# Patient Record
Sex: Male | Born: 1970 | Race: White | Hispanic: No | Smoking: Never smoker
Health system: Southern US, Community
[De-identification: ages and names within clinical notes are randomized; demographics above are authoritative.]

## PROBLEM LIST (undated history)

## (undated) DIAGNOSIS — E790 Hyperuricemia without signs of inflammatory arthritis and tophaceous disease: Secondary | ICD-10-CM

## (undated) DIAGNOSIS — I10 Essential (primary) hypertension: Secondary | ICD-10-CM

## (undated) DIAGNOSIS — R7309 Other abnormal glucose: Secondary | ICD-10-CM

## (undated) DIAGNOSIS — K209 Esophagitis, unspecified without bleeding: Secondary | ICD-10-CM

## (undated) DIAGNOSIS — E119 Type 2 diabetes mellitus without complications: Secondary | ICD-10-CM

## (undated) DIAGNOSIS — E786 Lipoprotein deficiency: Secondary | ICD-10-CM

## (undated) DIAGNOSIS — K5792 Diverticulitis of intestine, part unspecified, without perforation or abscess without bleeding: Secondary | ICD-10-CM

## (undated) DIAGNOSIS — E663 Overweight: Secondary | ICD-10-CM

## (undated) HISTORY — DX: Hyperuricemia without signs of inflammatory arthritis and tophaceous disease: E79.0

## (undated) HISTORY — DX: Esophagitis, unspecified without bleeding: K20.90

## (undated) HISTORY — DX: Other abnormal glucose: R73.09

## (undated) HISTORY — DX: Lipoprotein deficiency: E78.6

## (undated) HISTORY — DX: Overweight: E66.3

## (undated) HISTORY — DX: Type 2 diabetes mellitus without complications: E11.9

## (undated) HISTORY — DX: Diverticulitis of intestine, part unspecified, without perforation or abscess without bleeding: K57.92

---

## 2014-07-14 ENCOUNTER — Emergency Department
Admission: EM | Admit: 2014-07-14 | Discharge: 2014-07-14 | Disposition: A | Discharge status: Home / Self Care | Payer: BLUE CROSS/BLUE SHIELD | Attending: Emergency Medicine | Admitting: Emergency Medicine

## 2014-07-14 ENCOUNTER — Encounter: Payer: Self-pay | Admitting: Emergency Medicine

## 2014-07-14 ENCOUNTER — Emergency Department: Payer: BLUE CROSS/BLUE SHIELD

## 2014-07-14 DIAGNOSIS — K5732 Diverticulitis of large intestine without perforation or abscess without bleeding: Secondary | ICD-10-CM | POA: Diagnosis not present

## 2014-07-14 DIAGNOSIS — Z79899 Other long term (current) drug therapy: Secondary | ICD-10-CM | POA: Insufficient documentation

## 2014-07-14 DIAGNOSIS — D72829 Elevated white blood cell count, unspecified: Secondary | ICD-10-CM | POA: Insufficient documentation

## 2014-07-14 DIAGNOSIS — R109 Unspecified abdominal pain: Secondary | ICD-10-CM

## 2014-07-14 DIAGNOSIS — I10 Essential (primary) hypertension: Secondary | ICD-10-CM | POA: Insufficient documentation

## 2014-07-14 DIAGNOSIS — R1030 Lower abdominal pain, unspecified: Secondary | ICD-10-CM | POA: Diagnosis present

## 2014-07-14 HISTORY — DX: Essential (primary) hypertension: I10

## 2014-07-14 LAB — URINALYSIS COMPLETE WITH MICROSCOPIC (ARMC ONLY)
Bacteria, UA: NONE SEEN
Bilirubin Urine: NEGATIVE
GLUCOSE, UA: NEGATIVE mg/dL
Hgb urine dipstick: NEGATIVE
Ketones, ur: NEGATIVE mg/dL
NITRITE: NEGATIVE
Protein, ur: NEGATIVE mg/dL
RBC / HPF: NONE SEEN RBC/hpf (ref 0–5)
SPECIFIC GRAVITY, URINE: 1.011 (ref 1.005–1.030)
Squamous Epithelial / LPF: NONE SEEN
pH: 5 (ref 5.0–8.0)

## 2014-07-14 LAB — COMPREHENSIVE METABOLIC PANEL
ALK PHOS: 115 U/L (ref 38–126)
ALT: 18 U/L (ref 17–63)
AST: 21 U/L (ref 15–41)
Albumin: 3.7 g/dL (ref 3.5–5.0)
Anion gap: 9 (ref 5–15)
BUN: 10 mg/dL (ref 6–20)
CO2: 26 mmol/L (ref 22–32)
CREATININE: 1.15 mg/dL (ref 0.61–1.24)
Calcium: 8.7 mg/dL — ABNORMAL LOW (ref 8.9–10.3)
Chloride: 99 mmol/L — ABNORMAL LOW (ref 101–111)
GFR calc Af Amer: >60 mL/min (ref >60)
GFR calc non Af Amer: >60 mL/min (ref >60)
GLUCOSE: 143 mg/dL — AB (ref 65–99)
Potassium: 3.9 mmol/L (ref 3.5–5.1)
Sodium: 134 mmol/L — ABNORMAL LOW (ref 135–145)
Total Bilirubin: 0.5 mg/dL (ref 0.3–1.2)
Total Protein: 8 g/dL (ref 6.5–8.1)

## 2014-07-14 LAB — LIPASE, BLOOD: Lipase: 27 U/L (ref 22–51)

## 2014-07-14 LAB — CBC WITH DIFFERENTIAL/PLATELET
BASOS PCT: 1 %
Basophils Absolute: 0.1 10*3/uL (ref 0–0.1)
EOS ABS: 0.3 10*3/uL (ref 0–0.7)
Eosinophils Relative: 2 %
HEMATOCRIT: 51.4 % (ref 40.0–52.0)
Hemoglobin: 17.2 g/dL (ref 13.0–18.0)
Lymphocytes Relative: 9 %
Lymphs Abs: 2 10*3/uL (ref 1.0–3.6)
MCH: 28.1 pg (ref 26.0–34.0)
MCHC: 33.5 g/dL (ref 32.0–36.0)
MCV: 83.8 fL (ref 80.0–100.0)
MONOS PCT: 8 %
Monocytes Absolute: 1.8 10*3/uL — ABNORMAL HIGH (ref 0.2–1.0)
NEUTROS ABS: 17.3 10*3/uL — AB (ref 1.4–6.5)
Neutrophils Relative %: 80 %
Platelets: 295 10*3/uL (ref 150–440)
RBC: 6.13 MIL/uL — ABNORMAL HIGH (ref 4.40–5.90)
RDW: 14.6 % — ABNORMAL HIGH (ref 11.5–14.5)
WBC: 21.5 10*3/uL — AB (ref 3.8–10.6)

## 2014-07-14 MED ORDER — METRONIDAZOLE 500 MG PO TABS
ORAL_TABLET | ORAL | Status: AC
  Administered 2014-07-14: 500 mg via ORAL
  Filled 2014-07-14: qty 1

## 2014-07-14 MED ORDER — CIPROFLOXACIN HCL 500 MG PO TABS
500.0000 mg | ORAL_TABLET | Freq: Once | ORAL | Status: AC
  Administered 2014-07-14: 500 mg via ORAL

## 2014-07-14 MED ORDER — CIPROFLOXACIN HCL 500 MG PO TABS
ORAL_TABLET | ORAL | Status: AC
  Administered 2014-07-14: 500 mg via ORAL
  Filled 2014-07-14: qty 1

## 2014-07-14 MED ORDER — METRONIDAZOLE 500 MG PO TABS
500.0000 mg | ORAL_TABLET | Freq: Once | ORAL | Status: AC
  Administered 2014-07-14: 500 mg via ORAL

## 2014-07-14 MED ORDER — SODIUM CHLORIDE 0.9 % IV SOLN
Freq: Once | INTRAVENOUS | Status: AC
  Administered 2014-07-14: 14:00:00 via INTRAVENOUS

## 2014-07-14 MED ORDER — IOHEXOL 350 MG/ML SOLN
100.0000 mL | Freq: Once | INTRAVENOUS | Status: AC | PRN
  Administered 2014-07-14: 100 mL via INTRAVENOUS

## 2014-07-14 MED ORDER — CIPROFLOXACIN HCL 500 MG PO TABS: 500.0000 mg | ORAL_TABLET | Freq: Two times a day (BID) | ORAL | Status: AC

## 2014-07-14 MED ORDER — IOHEXOL 240 MG/ML SOLN
25.0000 mL | Freq: Once | INTRAMUSCULAR | Status: AC | PRN
  Administered 2014-07-14: 25 mL via ORAL

## 2014-07-14 MED ORDER — METRONIDAZOLE 500 MG PO TABS: 500.0000 mg | ORAL_TABLET | Freq: Three times a day (TID) | ORAL | Status: DC

## 2014-07-14 MED ORDER — OXYCODONE-ACETAMINOPHEN 5-325 MG PO TABS: 1.0000 | ORAL_TABLET | Freq: Four times a day (QID) | ORAL | Status: DC | PRN

## 2014-07-14 NOTE — ED Notes (Signed)
Pt informed to return if any life threatening symptoms occur.  

## 2014-07-14 NOTE — Discharge Instructions (Signed)

## 2014-07-14 NOTE — ED Provider Notes (Signed)
[facility] Emergency Department Provider Note     Time seen: ----------------------------------------- 1:42 PM on 07/14/2014 -----------------------------------------    I have reviewed the triage vital signs and the nursing notes.   HISTORY  Chief Complaint Abdominal Pain    HPI Winfred Iiams is a 44 y.o. male who presents ER for dull lower abdominal pain since yesterday. Patient states pain is persisted the point where hurts to walk, does not have a history of this, denies fevers chills nausea vomiting diarrhea. Patient states nothing makes the pain better. Went to the work nurse today and was sent here for evaluation. Pain currently is mild to moderate    Past Medical History  Diagnosis Date  . Hypertension     There are no active problems to display for this patient.   History reviewed. No pertinent past surgical history.  Current Outpatient Rx  Name  Route  Sig  Dispense  Refill  . lisinopril-hydrochlorothiazide (PRINZIDE,ZESTORETIC) 10-12.5 MG per tablet   Oral   Take 1 tablet by mouth daily.           Allergies Review of patient's allergies indicates no known allergies.  History reviewed. No pertinent family history.  Social History History  Substance Use Topics  . Smoking status: Never Smoker   . Smokeless tobacco: Not on file  . Alcohol Use: No    Review of Systems Constitutional: Negative for fever. Eyes: Negative for visual changes. ENT: Negative for sore throat. Cardiovascular: Negative for chest pain. Respiratory: Negative for shortness of breath. Gastrointestinal: As if her abdominal pain, negative for vomiting or diarrhea Genitourinary: Negative for dysuria. Musculoskeletal: Negative for back pain. Skin: Negative for rash. Neurological: Negative for headaches, focal weakness or numbness.  10-point ROS otherwise negative.  ____________________________________________   PHYSICAL EXAM:  VITAL  SIGNS: ED Triage Vitals  Enc Vitals Group     BP 07/14/14 1132 126/81 mmHg     Pulse Rate 07/14/14 1132 92     Resp 07/14/14 1132 20     Temp 07/14/14 1132 98.6 F (37 C)     Temp Source 07/14/14 1132 Oral     SpO2 07/14/14 1132 96 %     Weight 07/14/14 1132 270 lb (122.471 kg)     Height 07/14/14 1132  (1.803 m)     Head Cir --      Peak Flow --      Pain Score 07/14/14 1134 5     Pain Loc --      Pain Edu? --      Excl. in GC? --     Constitutional: Alert and oriented. Well appearing and in no distress. Eyes: Conjunctivae are normal. PERRL. Normal extraocular movements. ENT   Head: Normocephalic and atraumatic.   Nose: No congestion/rhinnorhea.   Mouth/Throat: Mucous membranes are moist.   Neck: No stridor. Hematological/Lymphatic/Immunilogical: No cervical lymphadenopathy. Cardiovascular: Normal rate, regular rhythm. Normal and symmetric distal pulses are present in all extremities. No murmurs, rubs, or gallops. Respiratory: Normal respiratory effort without tachypnea nor retractions. Breath sounds are clear and equal bilaterally. No wheezes/rales/rhonchi. Gastrointestinal: Mild suprapubic tenderness, hypoactive bowel sounds. No rebound or guarding. Pain over McBurney's point. Musculoskeletal: Nontender with normal range of motion in all extremities. No joint effusions.  No lower extremity tenderness nor edema. Neurologic:  Normal speech and language. No gross focal neurologic deficits are appreciated. Speech is normal. No gait instability. Skin:  Skin is warm, dry and intact. No rash noted. Psychiatric: Mood and affect  are normal. Speech and behavior are normal. Patient exhibits appropriate insight and judgment.  ____________________________________________    LABS (pertinent positives/negatives)  Labs Reviewed  CBC WITH DIFFERENTIAL/PLATELET - Abnormal; Notable for the following:    WBC 21.5 (*)    RBC 6.13 (*)    RDW 14.6 (*)    Neutro Abs 17.3  (*)    Monocytes Absolute 1.8 (*)    All other components within normal limits  COMPREHENSIVE METABOLIC PANEL - Abnormal; Notable for the following:    Sodium 134 (*)    Chloride 99 (*)    Glucose, Bld 143 (*)    Calcium 8.7 (*)    All other components within normal limits  URINALYSIS COMPLETEWITH MICROSCOPIC ([facility] ONLY) - Abnormal; Notable for the following:    Color, Urine YELLOW (*)    APPearance CLEAR (*)    Leukocytes, UA TRACE (*)    All other components within normal limits  LIPASE, BLOOD   ___________________________________________  ED COURSE:  Pertinent labs & imaging results that were available during my care of the patient were reviewed by me and considered in my medical decision making (see chart for details).  Patient with likely diverticulitis, marked leukocytosis. Will need CT abdomen and pelvis with contrast. ____________________________________________   RADIOLOGY  CT abdomen and pelvis with contrast is pending  ____________________________________________    FINAL ASSESSMENT AND PLAN  Abdominal pain and leukocytosis,  Plan: Patient's CT is pending at this time, will be checked out to Dr. Langston MaskerShaevitz for final disposition    Mayford KnifeWilliams, Cecille AmsterdamJonathan E, MD   Emily FilbertJonathan E Williams, MD 07/14/14 1459

## 2014-07-14 NOTE — ED Notes (Signed)
Developed lower abd pain since yesterday. Denies any n/v or urinary sx's

## 2014-07-14 NOTE — ED Provider Notes (Signed)
  Physical Exam  BP 121/92 mmHg  Pulse 92  Temp(Src) 98.6 F (37 C) (Oral)  Resp 16  Ht 5\' 11"  (1.803 m)  Wt 270 lb (122.471 kg)  BMI 37.67 kg/m2  SpO2 98%  Physical Exam  ED Course  Procedures  MDM Care assumed at sign out. Patient here with LLQ pain. WBC 21. Sign out pending CT. CT showed uncomplicated diverticulitis. No pain now. No diarrhea to suggest C diff. Will dc home with cipro, flagyl.   Richardean Canalavid H Yao, MD 07/14/14 [phone]

## 2015-04-29 ENCOUNTER — Other Ambulatory Visit: Payer: Self-pay | Admitting: Physician Assistant

## 2016-11-26 DIAGNOSIS — D751 Secondary polycythemia: Secondary | ICD-10-CM | POA: Insufficient documentation

## 2016-11-26 NOTE — Progress Notes (Deleted)
  [facility]  Telephone:(336) [phone] Fax:(336) [phone]  ID: Wayne Hernandez OB: 1970/10/18  MR#: [id number]  XNA#:[id number]  Patient Care Team: Patient, No Pcp Per as PCP - General (General Practice)  CHIEF COMPLAINT: Polycythemia, secondary.  INTERVAL HISTORY: ***  REVIEW OF SYSTEMS:   ROS  As per HPI. Otherwise, a complete review of systems is negative.  PAST MEDICAL HISTORY: Past Medical History:  Diagnosis Date  . Hypertension     PAST SURGICAL HISTORY: No past surgical history on file.  FAMILY HISTORY: No family history on file.  ADVANCED DIRECTIVES (Y/N):  N  HEALTH MAINTENANCE: Social History  Substance Use Topics  . Smoking status: Never Smoker  . Smokeless tobacco: Not on file  . Alcohol use No     Colonoscopy:  PAP:  Bone density:  Lipid panel:  No Known Allergies  Current Outpatient Prescriptions  Medication Sig Dispense Refill  . ibuprofen (ADVIL,MOTRIN) 200 MG tablet Take 600 mg by mouth daily as needed for moderate pain.    Marland Kitchen lisinopril-hydrochlorothiazide (PRINZIDE,ZESTORETIC) 10-12.5 MG per tablet Take 1 tablet by mouth daily.    . metroNIDAZOLE (FLAGYL) 500 MG tablet Take 1 tablet (500 mg total) by mouth 3 (three) times daily. 21 tablet 0  . oxyCODONE-acetaminophen (ROXICET) 5-325 MG per tablet Take 1 tablet by mouth every 6 (six) hours as needed. 20 tablet 0   No current facility-administered medications for this visit.     OBJECTIVE: There were no vitals filed for this visit.   There is no height or weight on file to calculate BMI.    ECOG FS:{CHL ONC [id number]  General: Well-developed, well-nourished, no acute distress. Eyes: Pink conjunctiva, anicteric sclera. HEENT: Normocephalic, moist mucous membranes, clear oropharnyx. Lungs: Clear to auscultation bilaterally. Heart: Regular rate and rhythm. No rubs, murmurs, or gallops. Abdomen: Soft, nontender, nondistended. No organomegaly noted, normoactive bowel  sounds. Musculoskeletal: No edema, cyanosis, or clubbing. Neuro: Alert, answering all questions appropriately. Cranial nerves grossly intact. Skin: No rashes or petechiae noted. Psych: Normal affect. Lymphatics: No cervical, calvicular, axillary or inguinal LAD.   LAB RESULTS:  Lab Results  Component Value Date   NA 134 (L) 07/14/2014   K 3.9 07/14/2014   CL 99 (L) 07/14/2014   CO2 26 07/14/2014   GLUCOSE 143 (H) 07/14/2014   BUN 10 07/14/2014   CREATININE 1.15 07/14/2014   CALCIUM 8.7 (L) 07/14/2014   PROT 8.0 07/14/2014   ALBUMIN 3.7 07/14/2014   AST 21 07/14/2014   ALT 18 07/14/2014   ALKPHOS 115 07/14/2014   BILITOT 0.5 07/14/2014   GFRNONAA >60 07/14/2014   GFRAA >60 07/14/2014    Lab Results  Component Value Date   WBC 21.5 (H) 07/14/2014   NEUTROABS 17.3 (H) 07/14/2014   HGB 17.2 07/14/2014   HCT 51.4 07/14/2014   MCV 83.8 07/14/2014   PLT 295 07/14/2014     STUDIES: No results found.  ASSESSMENT: Polycythemia, secondary  PLAN:    1, Polycythemia, secondary:  Patient expressed understanding and was in agreement with this plan. He also understands that He can call clinic at any time with any questions, concerns, or complaints.   Cancer Staging No matching staging information was found for the patient.  Jeralyn Ruths, MD   11/26/2016 10:50 PM

## 2016-11-29 ENCOUNTER — Encounter: Payer: Self-pay | Admitting: Oncology

## 2016-11-29 ENCOUNTER — Inpatient Hospital Stay: Payer: BLUE CROSS/BLUE SHIELD | Attending: Oncology | Admitting: Oncology

## 2016-11-29 ENCOUNTER — Inpatient Hospital Stay: Payer: BLUE CROSS/BLUE SHIELD

## 2016-11-29 ENCOUNTER — Inpatient Hospital Stay: Payer: BLUE CROSS/BLUE SHIELD | Admitting: Oncology

## 2016-11-29 VITALS — BP 116/78 | HR 67 | Temp 97.4°F | Ht 71.0 in | Wt 249.1 lb

## 2016-11-29 DIAGNOSIS — Z7982 Long term (current) use of aspirin: Secondary | ICD-10-CM

## 2016-11-29 DIAGNOSIS — Z79899 Other long term (current) drug therapy: Secondary | ICD-10-CM | POA: Insufficient documentation

## 2016-11-29 DIAGNOSIS — I10 Essential (primary) hypertension: Secondary | ICD-10-CM | POA: Diagnosis not present

## 2016-11-29 DIAGNOSIS — D751 Secondary polycythemia: Secondary | ICD-10-CM | POA: Diagnosis not present

## 2016-11-29 LAB — CBC
HEMATOCRIT: 50 % (ref 40.0–52.0)
Hemoglobin: 17.1 g/dL (ref 13.0–18.0)
MCH: 28.4 pg (ref 26.0–34.0)
MCHC: 34.2 g/dL (ref 32.0–36.0)
MCV: 82.9 fL (ref 80.0–100.0)
PLATELETS: 280 10*3/uL (ref 150–440)
RBC: 6.04 MIL/uL — ABNORMAL HIGH (ref 4.40–5.90)
RDW: 13.6 % (ref 11.5–14.5)
WBC: 11 10*3/uL — ABNORMAL HIGH (ref 3.8–10.6)

## 2016-11-29 LAB — IRON AND TIBC
IRON: 89 ug/dL (ref 45–182)
Saturation Ratios: 30 % (ref 17.9–39.5)
TIBC: 301 ug/dL (ref 250–450)
UIBC: 212 ug/dL

## 2016-11-29 LAB — FERRITIN: Ferritin: 115 ng/mL (ref 24–336)

## 2016-11-29 NOTE — Progress Notes (Signed)
[facility]  [phone] Fax:(336) [phone]  ID: Wayne Hernandez OB: 08/07/1970  MR#: [id number]  AOZ#:[id number]  Patient Care Team: Gilles Chiquito, MD as PCP - General (Family Medicine)  CHIEF COMPLAINT: Polycythemia, secondary.  INTERVAL HISTORY: Patient is a 46 year old male with no significant past medical history who was noted to have a slowly increasing hemoglobin on routine blood work. He is a nonsmoker and does not take testosterone injections. He currently feels well and is at his baseline. He has no neurologic complaints. He denies any recent fevers or illnesses. He has no new medications. He denies any chest pain or shortness of breath. He denies any nausea, vomiting, constipation, or diarrhea. He has no urinary complaints. Patient  offers no specific complaints today.  REVIEW OF SYSTEMS:   Review of Systems  Constitutional: Negative.  Negative for fever, malaise/fatigue and weight loss.  Respiratory: Negative.  Negative for cough and shortness of breath.   Cardiovascular: Negative.  Negative for chest pain and leg swelling.  Gastrointestinal: Negative.  Negative for abdominal pain, blood in stool and melena.  Genitourinary: Negative.   Musculoskeletal: Negative.   Skin: Negative.  Negative for rash.  Neurological: Negative.  Negative for sensory change and weakness.  Psychiatric/Behavioral: Negative.  The patient is not nervous/anxious.     As per HPI. Otherwise, a complete review of systems is negative.  PAST MEDICAL HISTORY: Past Medical History:  Diagnosis Date  . Hypertension     PAST SURGICAL HISTORY: History reviewed. No pertinent surgical history.  FAMILY HISTORY: History reviewed. No pertinent family history.  ADVANCED DIRECTIVES (Y/N):  N  HEALTH MAINTENANCE: Social History  Substance Use Topics  . Smoking status: Never Smoker  . Smokeless tobacco: Never Used  . Alcohol use Yes     Comment: occasional      Colonoscopy:  PAP:  Bone density:  Lipid panel:  No Known Allergies  Current Outpatient Prescriptions  Medication Sig Dispense Refill  . aspirin EC 81 MG tablet Take 81 mg by mouth daily.    Marland Kitchen ibuprofen (ADVIL,MOTRIN) 200 MG tablet Take 600 mg by mouth as needed.     Marland Kitchen lisinopril (PRINIVIL,ZESTRIL) 10 MG tablet Take 10 mg by mouth daily.     No current facility-administered medications for this visit.     OBJECTIVE: Vitals:   11/29/16 1356  BP: 116/78  Pulse: 67  Temp: (!) 97.4 F (36.3 C)     Body mass index is 34.75 kg/m.    ECOG FS:0 - Asymptomatic  General: Well-developed, well-nourished, no acute distress. Eyes: Pink conjunctiva, anicteric sclera. HEENT: Normocephalic, moist mucous membranes, clear oropharnyx. Lungs: Clear to auscultation bilaterally. Heart: Regular rate and rhythm. No rubs, murmurs, or gallops. Abdomen: Soft, nontender, nondistended. No organomegaly noted, normoactive bowel sounds. Musculoskeletal: No edema, cyanosis, or clubbing. Neuro: Alert, answering all questions appropriately. Cranial nerves grossly intact. Skin: No rashes or petechiae noted. Psych: Normal affect. Lymphatics: No cervical, calvicular, axillary or inguinal LAD.   LAB RESULTS:  Lab Results  Component Value Date   NA 134 (L) 07/14/2014   K 3.9 07/14/2014   CL 99 (L) 07/14/2014   CO2 26 07/14/2014   GLUCOSE 143 (H) 07/14/2014   BUN 10 07/14/2014   CREATININE 1.15 07/14/2014   CALCIUM 8.7 (L) 07/14/2014   PROT 8.0 07/14/2014   ALBUMIN 3.7 07/14/2014   AST 21 07/14/2014   ALT 18 07/14/2014   ALKPHOS 115 07/14/2014   BILITOT 0.5 07/14/2014   GFRNONAA >60 07/14/2014  GFRAA >60 07/14/2014    Lab Results  Component Value Date   WBC 11.0 (H) 11/29/2016   NEUTROABS 17.3 (H) 07/14/2014   HGB 17.1 11/29/2016   HCT 50.0 11/29/2016   MCV 82.9 11/29/2016   PLT 280 11/29/2016     STUDIES: No results found.  ASSESSMENT: Polycythemia, secondary  PLAN:     1, Polycythemia, secondary: By report, patient's hemoglobin has been trending up and his most recent value was greater than 18.0. Today's result is slightly decreased from that value at 17.1. The remainder of his laboratory work including iron stores, erythropoietin level, JAK-2 mutation, and hemachromatosis mutation are all pending at time of dictation. No intervention is needed at this time. Patient will return to clinic in 3 weeks with repeat laboratory work and further evaluation.  Approximately 45 minutes was spent in discussion of which greater than 50% was consultation.  Patient expressed understanding and was in agreement with this plan. He also understands that He can call clinic at any time with any questions, concerns, or complaints.    Wayne Ruthsimothy J Jennifermarie Franzen, MD   11/29/2016 3:16 PM

## 2016-11-29 NOTE — Progress Notes (Signed)
New patient consult for polycythemia. Patient states that he is feeling well today and denies having any pain. He states that his blood levels have been gradually rising over the past few years.

## 2016-11-30 LAB — ERYTHROPOIETIN: Erythropoietin: 9.3 m[IU]/mL (ref 2.6–18.5)

## 2016-12-02 LAB — CARBON MONOXIDE, BLOOD (PERFORMED AT REF LAB): Carbon Monoxide, Blood: 3.2 % (ref 0.0–3.6)

## 2016-12-03 LAB — JAK2 GENOTYPR

## 2016-12-05 ENCOUNTER — Encounter: Payer: Self-pay | Admitting: Family Medicine

## 2016-12-05 LAB — HEMOCHROMATOSIS DNA-PCR(C282Y,H63D)

## 2016-12-11 NOTE — Progress Notes (Signed)
[facility]  Telephone:(336) [phone] [phone]) [phone]  ID: Linward HeadlandJohnathan Agresta OB: 02/19/1970  MR#: [id number]  AOZ#:[id number]#:[id number]  Patient Care Team: Gilles Chiquitoabinowitz, Joseph H, MD as PCP - General (Family Medicine)  CHIEF COMPLAINT: Polycythemia, secondary.  INTERVAL HISTORY: Patient returns to clinic today for repeat laboratory can further evaluation.  He continues to feel well and remains asymptomatic. He has no neurologic complaints. He denies any recent fevers or illnesses. He has no new medications. He denies any chest pain or shortness of breath. He denies any nausea, vomiting, constipation, or diarrhea. He has no urinary complaints. Patient  offers no specific complaints today.  REVIEW OF SYSTEMS:   Review of Systems  Constitutional: Negative.  Negative for fever, malaise/fatigue and weight loss.  Respiratory: Negative.  Negative for cough and shortness of breath.   Cardiovascular: Negative.  Negative for chest pain and leg swelling.  Gastrointestinal: Negative.  Negative for abdominal pain.  Genitourinary: Negative.   Musculoskeletal: Negative.   Skin: Negative.  Negative for rash.  Neurological: Negative.   Psychiatric/Behavioral: Negative.  The patient is not nervous/anxious.     As per HPI. Otherwise, a complete review of systems is negative.  PAST MEDICAL HISTORY: Past Medical History:  Diagnosis Date  . Hypertension     PAST SURGICAL HISTORY: History reviewed. No pertinent surgical history.  FAMILY HISTORY: History reviewed. No pertinent family history.  ADVANCED DIRECTIVES (Y/N):  N  HEALTH MAINTENANCE: Social History   Tobacco Use  . Smoking status: Never Smoker  . Smokeless tobacco: Never Used  Substance Use Topics  . Alcohol use: Yes    Comment: occasional  . Drug use: Not on file     Colonoscopy:  PAP:  Bone density:  Lipid panel:  No Known Allergies  Current Outpatient Medications  Medication Sig Dispense Refill  . aspirin EC  81 MG tablet Take 81 mg by mouth daily.    Marland Kitchen. ibuprofen (ADVIL,MOTRIN) 200 MG tablet Take 600 mg by mouth as needed.     Marland Kitchen. lisinopril (PRINIVIL,ZESTRIL) 10 MG tablet Take 10 mg by mouth daily.     No current facility-administered medications for this visit.     OBJECTIVE: Vitals:   12/17/16 1528  BP: 125/87  Pulse: 66  Resp: 20  Temp: (!) 97.5 F (36.4 C)     Body mass index is 35.45 kg/m.    ECOG FS:0 - Asymptomatic  General: Well-developed, well-nourished, no acute distress. Eyes: Pink conjunctiva, anicteric sclera. Lungs: Clear to auscultation bilaterally. Heart: Regular rate and rhythm. No rubs, murmurs, or gallops. Abdomen: Soft, nontender, nondistended. No organomegaly noted, normoactive bowel sounds. Musculoskeletal: No edema, cyanosis, or clubbing. Neuro: Alert, answering all questions appropriately. Cranial nerves grossly intact. Skin: No rashes or petechiae noted. Psych: Normal affect.   LAB RESULTS:  Lab Results  Component Value Date   NA 134 (L) 07/14/2014   K 3.9 07/14/2014   CL 99 (L) 07/14/2014   CO2 26 07/14/2014   GLUCOSE 143 (H) 07/14/2014   BUN 10 07/14/2014   CREATININE 1.15 07/14/2014   CALCIUM 8.7 (L) 07/14/2014   PROT 8.0 07/14/2014   ALBUMIN 3.7 07/14/2014   AST 21 07/14/2014   ALT 18 07/14/2014   ALKPHOS 115 07/14/2014   BILITOT 0.5 07/14/2014   GFRNONAA >60 07/14/2014   GFRAA >60 07/14/2014    Lab Results  Component Value Date   WBC 11.2 (H) 12/17/2016   NEUTROABS 7.4 (H) 12/17/2016   HGB 17.3 12/17/2016   HCT 51.0 12/17/2016  MCV 83.9 12/17/2016   PLT 269 12/17/2016     STUDIES: No results found.  ASSESSMENT: Polycythemia, secondary  PLAN:    1, Polycythemia, secondary: Patient's hemoglobin is currently within normal limits at 17.3.  Previously all of his laboratory work including iron stores, erythropoietin level, and JAK-2 mutation were either negative or within normal limits.  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  No intervention is needed.  Return to clinic in 6 months with repeat laboratory work and further evaluation.  If patient's hemoglobin remained stable, he likely can be discharged from clinic at that time. 2.  Leukocytosis: Mild, monitor.  Approximately 20 minutes was spent in discussion of which greater than 50% was consultation.  Patient expressed understanding and was in agreement with this plan. He also understands that He can call clinic at any time with any questions, concerns, or complaints.    Jeralyn Ruths, MD   12/18/2016 1:59 PM

## 2016-12-17 ENCOUNTER — Inpatient Hospital Stay: Payer: BLUE CROSS/BLUE SHIELD

## 2016-12-17 ENCOUNTER — Encounter: Payer: Self-pay | Admitting: Oncology

## 2016-12-17 ENCOUNTER — Inpatient Hospital Stay: Payer: BLUE CROSS/BLUE SHIELD | Attending: Oncology | Admitting: Oncology

## 2016-12-17 VITALS — BP 125/87 | HR 66 | Temp 97.5°F | Resp 20 | Wt 254.2 lb

## 2016-12-17 DIAGNOSIS — D751 Secondary polycythemia: Secondary | ICD-10-CM | POA: Diagnosis not present

## 2016-12-17 DIAGNOSIS — Z7982 Long term (current) use of aspirin: Secondary | ICD-10-CM | POA: Insufficient documentation

## 2016-12-17 DIAGNOSIS — Z79899 Other long term (current) drug therapy: Secondary | ICD-10-CM | POA: Diagnosis not present

## 2016-12-17 DIAGNOSIS — D72829 Elevated white blood cell count, unspecified: Secondary | ICD-10-CM | POA: Diagnosis not present

## 2016-12-17 DIAGNOSIS — I10 Essential (primary) hypertension: Secondary | ICD-10-CM | POA: Diagnosis not present

## 2016-12-17 LAB — CBC WITH DIFFERENTIAL/PLATELET
Basophils Absolute: 0.1 10*3/uL (ref 0–0.1)
Basophils Relative: 1 %
Eosinophils Absolute: 0.4 10*3/uL (ref 0–0.7)
Eosinophils Relative: 4 %
HCT: 51 % (ref 40.0–52.0)
Hemoglobin: 17.3 g/dL (ref 13.0–18.0)
Lymphocytes Relative: 22 %
Lymphs Abs: 2.4 10*3/uL (ref 1.0–3.6)
MCH: 28.5 pg (ref 26.0–34.0)
MCHC: 33.9 g/dL (ref 32.0–36.0)
MCV: 83.9 fL (ref 80.0–100.0)
Monocytes Absolute: 0.8 10*3/uL (ref 0.2–1.0)
Monocytes Relative: 8 %
NEUTROS ABS: 7.4 10*3/uL — AB (ref 1.4–6.5)
NEUTROS PCT: 65 %
Platelets: 269 10*3/uL (ref 150–440)
RBC: 6.08 MIL/uL — ABNORMAL HIGH (ref 4.40–5.90)
RDW: 13.7 % (ref 11.5–14.5)
WBC: 11.2 10*3/uL — ABNORMAL HIGH (ref 3.8–10.6)

## 2016-12-17 NOTE — Progress Notes (Signed)
Patient denies any concerns today.  

## 2017-06-13 NOTE — Progress Notes (Signed)
[facility]  Telephone:(336) [phone] Fax:(336) [phone]  ID: Wayne Hernandez OB: 27-Feb-1970  MR#: [id number]  QIO#:[id number]  Patient Care Team: Gilles Chiquito, MD as PCP - General (Family Medicine)  CHIEF COMPLAINT: Polycythemia, secondary.  INTERVAL HISTORY: Patient returns to clinic today for repeat laboratory work and further evaluation.  He continues to feel well and remains asymptomatic. He has no neurologic complaints. He denies any recent fevers or illnesses. He denies any chest pain or shortness of breath. He denies any nausea, vomiting, constipation, or diarrhea. He has no urinary complaints.  Patient feels at his baseline offers no specific complaints today.  REVIEW OF SYSTEMS:   Review of Systems  Constitutional: Negative.  Negative for fever, malaise/fatigue and weight loss.  Respiratory: Negative.  Negative for cough and shortness of breath.   Cardiovascular: Negative.  Negative for chest pain and leg swelling.  Gastrointestinal: Negative.  Negative for abdominal pain and constipation.  Genitourinary: Negative.  Negative for dysuria.  Musculoskeletal: Negative.  Negative for myalgias.  Skin: Negative.  Negative for rash.  Neurological: Negative.  Negative for sensory change, focal weakness and weakness.  Psychiatric/Behavioral: Negative.  The patient is not nervous/anxious.     As per HPI. Otherwise, a complete review of systems is negative.  PAST MEDICAL HISTORY: Past Medical History:  Diagnosis Date  . Hypertension     PAST SURGICAL HISTORY: History reviewed. No pertinent surgical history.  FAMILY HISTORY: History reviewed. No pertinent family history.  ADVANCED DIRECTIVES (Y/N):  N  HEALTH MAINTENANCE: Social History   Tobacco Use  . Smoking status: Never Smoker  . Smokeless tobacco: Never Used  Substance Use Topics  . Alcohol use: Yes    Comment: occasional  . Drug use: Not on file     Colonoscopy:  PAP:  Bone  density:  Lipid panel:  No Known Allergies  Current Outpatient Medications  Medication Sig Dispense Refill  . aspirin EC 81 MG tablet Take 81 mg by mouth daily.    Marland Kitchen ibuprofen (ADVIL,MOTRIN) 200 MG tablet Take 600 mg by mouth as needed.     Marland Kitchen lisinopril (PRINIVIL,ZESTRIL) 10 MG tablet Take 10 mg by mouth daily.    . phentermine 37.5 MG capsule Take 37.5 mg by mouth every morning.     No current facility-administered medications for this visit.     OBJECTIVE: Vitals:   06/17/17 1553  BP: 133/90  Pulse: 81  Resp: 18  Temp: 97.6 F (36.4 C)     Body mass index is 36.68 kg/m.    ECOG FS:0 - Asymptomatic  General: Well-developed, well-nourished, no acute distress. Eyes: Pink conjunctiva, anicteric sclera. Lungs: Clear to auscultation bilaterally. Heart: Regular rate and rhythm. No rubs, murmurs, or gallops. Abdomen: Soft, nontender, nondistended. No organomegaly noted, normoactive bowel sounds. Musculoskeletal: No edema, cyanosis, or clubbing. Neuro: Alert, answering all questions appropriately. Cranial nerves grossly intact. Skin: No rashes or petechiae noted. Psych: Normal affect.   LAB RESULTS:  Lab Results  Component Value Date   NA 134 (L) 07/14/2014   K 3.9 07/14/2014   CL 99 (L) 07/14/2014   CO2 26 07/14/2014   GLUCOSE 143 (H) 07/14/2014   BUN 10 07/14/2014   CREATININE 1.15 07/14/2014   CALCIUM 8.7 (L) 07/14/2014   PROT 8.0 07/14/2014   ALBUMIN 3.7 07/14/2014   AST 21 07/14/2014   ALT 18 07/14/2014   ALKPHOS 115 07/14/2014   BILITOT 0.5 07/14/2014   GFRNONAA >60 07/14/2014   GFRAA >60 07/14/2014  Lab Results  Component Value Date   WBC 11.8 (H) 06/17/2017   NEUTROABS 8.5 (H) 06/17/2017   HGB 17.7 06/17/2017   HCT 50.9 06/17/2017   MCV 83.4 06/17/2017   PLT 324 06/17/2017     STUDIES: No results found.  ASSESSMENT: Polycythemia, secondary  PLAN:    1, Polycythemia, secondary: Patient's hemoglobin remains mildly elevated at 17.7, but  still within normal limits.  This has remained stable for at least one year.  Previously, all of his laboratory work including iron stores, erythropoietin level, and JAK-2 mutation were either negative or within normal limits.  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  No intervention is needed.  Please refer patient back if his hemoglobin trends up or he becomes symptomatic.  No further follow-up has been scheduled. 2.  Leukocytosis: Mild, monitor.  Patient will have a lab only appointment in the next 1 to 2 weeks to assess peripheral blood flow cytometry and BCR-ABL testing.  If negative, no further follow-up is necessary as above.  Approximately 20 minutes was spent in discussion of which greater than 50% was consultation.  Patient expressed understanding and was in agreement with this plan. He also understands that He can call clinic at any time with any questions, concerns, or complaints.    Jeralyn Ruths, MD   06/22/2017 8:36 AM

## 2017-06-17 ENCOUNTER — Inpatient Hospital Stay: Payer: BLUE CROSS/BLUE SHIELD | Attending: Oncology

## 2017-06-17 ENCOUNTER — Encounter: Payer: Self-pay | Admitting: Oncology

## 2017-06-17 ENCOUNTER — Inpatient Hospital Stay (HOSPITAL_BASED_OUTPATIENT_CLINIC_OR_DEPARTMENT_OTHER): Payer: BLUE CROSS/BLUE SHIELD | Admitting: Oncology

## 2017-06-17 VITALS — BP 133/90 | HR 81 | Temp 97.6°F | Resp 18 | Wt 263.0 lb

## 2017-06-17 DIAGNOSIS — D751 Secondary polycythemia: Secondary | ICD-10-CM | POA: Insufficient documentation

## 2017-06-17 DIAGNOSIS — D72829 Elevated white blood cell count, unspecified: Secondary | ICD-10-CM | POA: Diagnosis not present

## 2017-06-17 LAB — CBC WITH DIFFERENTIAL/PLATELET
BASOS PCT: 1 %
Basophils Absolute: 0.1 10*3/uL (ref 0–0.1)
EOS PCT: 3 %
Eosinophils Absolute: 0.3 10*3/uL (ref 0–0.7)
HEMATOCRIT: 50.9 % (ref 40.0–52.0)
Hemoglobin: 17.7 g/dL (ref 13.0–18.0)
LYMPHS PCT: 19 %
Lymphs Abs: 2.2 10*3/uL (ref 1.0–3.6)
MCH: 29.1 pg (ref 26.0–34.0)
MCHC: 34.8 g/dL (ref 32.0–36.0)
MCV: 83.4 fL (ref 80.0–100.0)
MONO ABS: 0.8 10*3/uL (ref 0.2–1.0)
Monocytes Relative: 6 %
NEUTROS PCT: 71 %
Neutro Abs: 8.5 10*3/uL — ABNORMAL HIGH (ref 1.4–6.5)
PLATELETS: 324 10*3/uL (ref 150–440)
RBC: 6.1 MIL/uL — ABNORMAL HIGH (ref 4.40–5.90)
RDW: 13.6 % (ref 11.5–14.5)
WBC: 11.8 10*3/uL — ABNORMAL HIGH (ref 3.8–10.6)

## 2017-06-17 NOTE — Progress Notes (Signed)
Pt in for follow up. Reports doing well, no concerns.

## 2018-09-29 ENCOUNTER — Other Ambulatory Visit: Payer: Self-pay

## 2018-09-29 MED ORDER — LISINOPRIL 10 MG PO TABS: 10.0000 mg | ORAL_TABLET | Freq: Every day | ORAL | 3 refills | Status: DC

## 2018-12-11 ENCOUNTER — Other Ambulatory Visit: Payer: Self-pay

## 2018-12-11 DIAGNOSIS — Z20822 Contact with and (suspected) exposure to covid-19: Secondary | ICD-10-CM

## 2018-12-12 LAB — NOVEL CORONAVIRUS, NAA: SARS-CoV-2, NAA: NOT DETECTED

## 2019-06-07 NOTE — Progress Notes (Signed)
Scheduled to complete physical 06/17/19.  (Provider TBD)  AMD

## 2019-06-08 ENCOUNTER — Other Ambulatory Visit: Payer: Self-pay

## 2019-06-08 ENCOUNTER — Ambulatory Visit: Payer: Self-pay

## 2019-06-08 DIAGNOSIS — Z0000 Encounter for general adult medical examination without abnormal findings: Secondary | ICD-10-CM

## 2019-06-08 LAB — POCT URINALYSIS DIPSTICK
Bilirubin, UA: NEGATIVE
Blood, UA: NEGATIVE
Glucose, UA: NEGATIVE
Ketones, UA: NEGATIVE
Nitrite, UA: NEGATIVE
Protein, UA: NEGATIVE
Spec Grav, UA: 1.02 (ref 1.010–1.025)
Urobilinogen, UA: 0.2 E.U./dL
pH, UA: 6 (ref 5.0–8.0)

## 2019-06-09 LAB — CMP12+LP+TP+TSH+6AC+PSA+CBC…
ALT: 18 IU/L (ref 0–44)
Albumin: 4 g/dL (ref 4.0–5.0)
Alkaline Phosphatase: 119 IU/L — ABNORMAL HIGH (ref 39–117)
BUN/Creatinine Ratio: 9 (ref 9–20)
BUN: 9 mg/dL (ref 6–24)
Basophils Absolute: 0.1 10*3/uL (ref 0.0–0.2)
Basos: 1 %
Bilirubin Total: 0.5 mg/dL (ref 0.0–1.2)
Calcium: 9.1 mg/dL (ref 8.7–10.2)
Chloride: 99 mmol/L (ref 96–106)
Chol/HDL Ratio: 5.3 ratio — ABNORMAL HIGH (ref 0.0–5.0)
Cholesterol, Total: 202 mg/dL — ABNORMAL HIGH (ref 100–199)
EOS (ABSOLUTE): 0.3 10*3/uL (ref 0.0–0.4)
Eos: 3 %
Estimated CHD Risk: 1.1 times avg. — ABNORMAL HIGH (ref 0.0–1.0)
Free Thyroxine Index: 2 (ref 1.2–4.9)
GFR calc non Af Amer: 86 mL/min/{1.73_m2} (ref >59)
Globulin, Total: 3.3 g/dL (ref 1.5–4.5)
HDL: 38 mg/dL — ABNORMAL LOW (ref >39)
Hematocrit: 50.8 % (ref 37.5–51.0)
Immature Granulocytes: 0 %
Iron: 86 ug/dL (ref 38–169)
LDH: 161 IU/L (ref 121–224)
LDL Chol Calc (NIH): 143 mg/dL — ABNORMAL HIGH (ref 0–99)
Lymphs: 14 %
MCH: 28.6 pg (ref 26.6–33.0)
MCHC: 34.1 g/dL (ref 31.5–35.7)
MCV: 84 fL (ref 79–97)
Monocytes Absolute: 0.8 10*3/uL (ref 0.1–0.9)
Monocytes: 7 %
Neutrophils Absolute: 7.8 10*3/uL — ABNORMAL HIGH (ref 1.4–7.0)
Neutrophils: 75 %
Phosphorus: 2.3 mg/dL — ABNORMAL LOW (ref 2.8–4.1)
Platelets: 331 10*3/uL (ref 150–450)
Prostate Specific Ag, Serum: 0.4 ng/mL (ref 0.0–4.0)
RBC: 6.04 x10E6/uL — ABNORMAL HIGH (ref 4.14–5.80)
RDW: 13.5 % (ref 11.6–15.4)
Sodium: 137 mmol/L (ref 134–144)
T3 Uptake Ratio: 23 % — ABNORMAL LOW (ref 24–39)
T4, Total: 8.8 ug/dL (ref 4.5–12.0)
TSH: 0.715 u[IU]/mL (ref 0.450–4.500)
Total Protein: 7.3 g/dL (ref 6.0–8.5)
Uric Acid: 8.2 mg/dL (ref 3.8–8.4)
VLDL Cholesterol Cal: 21 mg/dL (ref 5–40)
WBC: 10.5 10*3/uL (ref 3.4–10.8)

## 2019-06-09 LAB — CMP12+LP+TP+TSH+6AC+PSA+CBC?
AST: 20 IU/L (ref 0–40)
Albumin/Globulin Ratio: 1.2 (ref 1.2–2.2)
Creatinine, Ser: 1.02 mg/dL (ref 0.76–1.27)
GFR calc Af Amer: 99 mL/min/{1.73_m2} (ref >59)
GGT: 26 IU/L (ref 0–65)
Glucose: 150 mg/dL — ABNORMAL HIGH (ref 65–99)
Hemoglobin: 17.3 g/dL (ref 13.0–17.7)
Immature Grans (Abs): 0 10*3/uL (ref 0.0–0.1)
Lymphocytes Absolute: 1.5 10*3/uL (ref 0.7–3.1)
Potassium: 4.7 mmol/L (ref 3.5–5.2)
Triglycerides: 117 mg/dL (ref 0–149)

## 2019-06-17 ENCOUNTER — Ambulatory Visit: Payer: 59 | Admitting: Physician Assistant

## 2019-06-17 ENCOUNTER — Other Ambulatory Visit: Payer: Self-pay

## 2019-06-17 ENCOUNTER — Encounter: Payer: Self-pay | Admitting: Physician Assistant

## 2019-06-17 VITALS — BP 129/88 | HR 99 | Temp 99.7°F | Resp 16 | Ht 70.0 in | Wt 265.0 lb

## 2019-06-17 DIAGNOSIS — Z0000 Encounter for general adult medical examination without abnormal findings: Secondary | ICD-10-CM

## 2019-06-17 NOTE — Progress Notes (Signed)
EKG completed today.  AMD

## 2019-06-17 NOTE — Progress Notes (Signed)
   Subjective: Annual physical exam    Patient ID: Wayne Hernandez, male    DOB: Feb 28, 1970, 49 y.o.   MRN: [id number]  HPI Patient presents for annual physical exam.  Patient voiced no complaints or concerns.   Review of Systems    No acute distress.  Overweight.  Hypertension Objective:   Physical Exam No acute distress.  HEENT grossly unremarkable.  Neck is supple for adenopathy or bruits.  Heart is regular rate and rhythm.  Abdomen is obese appearance, normoactive bowel sounds, soft nontender palpation.  No obvious deformity to the upper or lower extremities.  Patient has full equal range of motion of the upper lower extremities.  No obvious deformity to the cervical lumbar spine.  Patient full equal range of motion cervical lumbar spine.  Cranial nerves II through XII grossly intact.       Assessment & Plan: Well exam.  Discussed lab results with patient.  Advised continue to monitor cholesterol and consider weight loss.  Follow-up as needed.

## 2019-09-23 ENCOUNTER — Ambulatory Visit: Payer: 59

## 2019-10-01 ENCOUNTER — Other Ambulatory Visit: Payer: Self-pay | Admitting: Internal Medicine

## 2019-10-01 DIAGNOSIS — I10 Essential (primary) hypertension: Secondary | ICD-10-CM

## 2019-11-24 ENCOUNTER — Other Ambulatory Visit: Payer: Self-pay

## 2019-11-24 ENCOUNTER — Ambulatory Visit: Payer: 59 | Admitting: Podiatry

## 2019-11-24 ENCOUNTER — Ambulatory Visit (INDEPENDENT_AMBULATORY_CARE_PROVIDER_SITE_OTHER): Payer: 59

## 2019-11-24 ENCOUNTER — Encounter: Payer: Self-pay | Admitting: Podiatry

## 2019-11-24 DIAGNOSIS — M79671 Pain in right foot: Secondary | ICD-10-CM | POA: Diagnosis not present

## 2019-11-24 DIAGNOSIS — M9261 Juvenile osteochondrosis of tarsus, right ankle: Secondary | ICD-10-CM

## 2019-11-24 DIAGNOSIS — M722 Plantar fascial fibromatosis: Secondary | ICD-10-CM

## 2019-11-24 DIAGNOSIS — M6528 Calcific tendinitis, other site: Secondary | ICD-10-CM | POA: Diagnosis not present

## 2019-11-24 DIAGNOSIS — M216X1 Other acquired deformities of right foot: Secondary | ICD-10-CM | POA: Diagnosis not present

## 2019-11-24 DIAGNOSIS — M21861 Other specified acquired deformities of right lower leg: Secondary | ICD-10-CM

## 2019-11-24 NOTE — Progress Notes (Signed)
  Subjective:  Patient ID: Wayne Hernandez, male    DOB: 13-Feb-1970,  MRN: [id number]  Chief Complaint  Patient presents with  . Foot Pain    Patient presents today for pain in right heel/achilles area x years off and on and has become worse in the past year    49 y.o. male presents with the above complaint. History confirmed with patient.  20 years ago he was wrestling with his brother and twisted and felt a pop.  He was seen at urgent care and x-ray was taken and showed a heel spur  Objective:  Physical Exam: warm, good capillary refill, no trophic changes or ulcerative lesions, normal DP and PT pulses and normal sensory exam.   Right Foot: Pain on palpation to the Achilles tendon insertion on the posterior heel.  Gastrocnemius equinus is present with a positive Silfverskiold test  \Radiographs: X-ray of the right foot: Haglund's deformity is present and a posterior calcaneal enthesophyte is present as well, appears to have fractured a portion of this Assessment:   1. 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 Acquired Haglund's deformity of right heel   3. Pain of right heel      Plan:  Patient was evaluated and treated and all questions answered.   Discussed etiology and treatment options of the condition in detail with the patient.  We discussed surgical and nonsurgical treatment.  Nonsurgical we discussed heel lifts, padding, and wearing open back shoes that do not put pressure on this.  We also discussed shockwave therapy, I do not know if this would be very beneficial for him due to the fracture of the heel spur.  I recommended surgical intervention and he is amenable to this as well as it has been bothering him for several years.    Surgically we discussed the following: Resection of the calcaneal spur with reflection of the Achilles tendon, resection of the Haglund's deformity and reanchoring of the Achilles tendon as well as a gastrocnemius recession.  I  believe it would be beneficial to obtain an MRI of the heel to evaluate the size of the spur as well as for any tears or calcification within the tendon itself to plan for operative debridement and resection.  Follow-up in 3 to 4 weeks after MRI to plan for surgical intervention  Return in about 4 weeks (around 12/22/2019).

## 2019-12-03 ENCOUNTER — Other Ambulatory Visit: Payer: Self-pay

## 2019-12-03 DIAGNOSIS — M9261 Juvenile osteochondrosis of tarsus, right ankle: Secondary | ICD-10-CM

## 2019-12-03 DIAGNOSIS — M6528 Calcific tendinitis, other site: Secondary | ICD-10-CM

## 2019-12-07 ENCOUNTER — Other Ambulatory Visit: Payer: Self-pay | Admitting: Podiatry

## 2019-12-12 ENCOUNTER — Other Ambulatory Visit: Payer: Self-pay

## 2019-12-12 ENCOUNTER — Ambulatory Visit
Admission: RE | Admit: 2019-12-12 | Discharge: 2019-12-12 | Disposition: A | Discharge status: Home / Self Care | Payer: 59 | Source: Ambulatory Visit | Attending: Podiatry | Admitting: Podiatry

## 2019-12-12 DIAGNOSIS — M9261 Juvenile osteochondrosis of tarsus, right ankle: Secondary | ICD-10-CM

## 2019-12-12 DIAGNOSIS — M6528 Calcific tendinitis, other site: Secondary | ICD-10-CM

## 2019-12-12 DIAGNOSIS — M7731 Calcaneal spur, right foot: Secondary | ICD-10-CM | POA: Diagnosis not present

## 2019-12-15 ENCOUNTER — Other Ambulatory Visit: Payer: Self-pay

## 2019-12-15 ENCOUNTER — Ambulatory Visit (INDEPENDENT_AMBULATORY_CARE_PROVIDER_SITE_OTHER): Payer: 59 | Admitting: Podiatry

## 2019-12-15 ENCOUNTER — Encounter: Payer: Self-pay | Admitting: Podiatry

## 2019-12-15 VITALS — Ht 70.0 in | Wt 280.0 lb

## 2019-12-15 DIAGNOSIS — M9261 Juvenile osteochondrosis of tarsus, right ankle: Secondary | ICD-10-CM

## 2019-12-15 DIAGNOSIS — M21861 Other specified acquired deformities of right lower leg: Secondary | ICD-10-CM

## 2019-12-15 DIAGNOSIS — M216X1 Other acquired deformities of right foot: Secondary | ICD-10-CM

## 2019-12-15 DIAGNOSIS — M6528 Calcific tendinitis, other site: Secondary | ICD-10-CM

## 2019-12-15 DIAGNOSIS — M79671 Pain in right foot: Secondary | ICD-10-CM

## 2019-12-15 NOTE — Patient Instructions (Signed)
Pre-Operative Instructions  Congratulations, you have decided to take an important step towards improving your quality of life.  You can be assured that the doctors and staff at [facility] will be with you every step of the way.  Here are some important things you should know:  1. Plan to be at the surgery center/hospital at least 1 (one) hour prior to your scheduled time, unless otherwise directed by the surgical center/hospital staff.  You must have a responsible adult accompany you, remain during the surgery and drive you home.  Make sure you have directions to the surgical center/hospital to ensure you arrive on time. 2. If you are having surgery at [facility] or [facility], you will need a copy of your medical history and physical form from your family physician within one month prior to the date of surgery. We will give you a form for your primary physician to complete.  3. We make every effort to accommodate the date you request for surgery.  However, there are times where surgery dates or times have to be moved.  We will contact you as soon as possible if a change in schedule is required.   4. No aspirin/ibuprofen for one week before surgery.  If you are on aspirin, any non-steroidal anti-inflammatory medications (Mobic, Aleve, Ibuprofen) should not be taken seven (7) days prior to your surgery.  You make take Tylenol for pain prior to surgery.  5. Medications - If you are taking daily heart and blood pressure medications, seizure, reflux, allergy, asthma, anxiety, pain or diabetes medications, make sure you notify the surgery center/hospital before the day of surgery so they can tell you which medications you should take or avoid the day of surgery. 6. No food or drink after midnight the night before surgery unless directed otherwise by surgical center/hospital staff. 7. No alcoholic beverages 24-hours prior to surgery.  No smoking 24-hours prior or 24-hours after  surgery. 8. Wear loose pants or shorts. They should be loose enough to fit over bandages, boots, and casts. 9. Don't wear slip-on shoes. Sneakers are preferred. 10. Bring your boot with you to the surgery center/hospital.  Also bring crutches or a walker if your physician has prescribed it for you.  If you do not have this equipment, it will be provided for you after surgery. 11. If you have not been contacted by the surgery center/hospital by the day before your surgery, call to confirm the date and time of your surgery. 12. Leave-time from work may vary depending on the type of surgery you have.  Appropriate arrangements should be made prior to surgery with your employer. 13. Prescriptions will be provided immediately following surgery by your doctor.  Fill these as soon as possible after surgery and take the medication as directed. Pain medications will not be refilled on weekends and must be approved by the doctor. 14. Remove nail polish on the operative foot and avoid getting pedicures prior to surgery. 15. Wash the night before surgery.  The night before surgery wash the foot and leg well with water and the antibacterial soap provided. Be sure to pay special attention to beneath the toenails and in between the toes.  Wash for at least three (3) minutes. Rinse thoroughly with water and dry well with a towel.  Perform this wash unless told not to do so by your physician.  Enclosed: 1 Ice pack (please put in freezer the night before surgery)   1 Hibiclens skin cleaner     Pre-op instructions  If you have any questions regarding the instructions, please do not hesitate to call our office.  [place]: [address], , [place] [address] -- [phone]  [place]: [address] [place], [place] [address] -- [phone]  [place]: [address], [place], [place] [address] -- [phone]   Website: [email or web address] 

## 2019-12-15 NOTE — Progress Notes (Signed)
Subjective:  Patient ID: Wayne Hernandez, male    DOB: Jun 07, 1970,  MRN: [id number]  Chief Complaint  Patient presents with  . Foot Pain    "its no better"  here to discuss MRI results    49 y.o. male returns with the above complaint. History confirmed with patient.  He completed his MRI and is here for review and surgical planning.  His pain is the same and has not improved at all  Objective:  Physical Exam: warm, good capillary refill, no trophic changes or ulcerative lesions, normal DP and PT pulses and normal sensory exam.   Right Foot: Pain on palpation to the Achilles tendon insertion on the posterior heel.  Gastrocnemius equinus is present with a positive Silfverskiold test  Radiographs: X-ray of the right foot: Haglund's deformity is present and a posterior calcaneal enthesophyte is present as well, appears to have fractured a portion of this  Narrative & Impression  CLINICAL DATA:  Heel pain for 20 years. Heel spur. Evaluate for plantar fascial tear, acquired Haglund's deformity.  EXAM: MR OF THE RIGHT HEEL WITHOUT CONTRAST  TECHNIQUE: Multiplanar, multisequence MR imaging of the right hindfoot was performed. No intravenous contrast was administered.  COMPARISON:  Foot radiographs 11/24/2019  FINDINGS: TENDONS  Peroneal: Intact and normally positioned.  Posteromedial: Intact and normally positioned.  Anterior: Intact and normally positioned.  Achilles: Intact. Mild enthesophyte formation at the Achilles insertion on the calcaneal tuberosity without reactive marrow edema, Haglund deformity or bursal fluid collection.  Plantar Fascia: Intact. There is minimal T2 hyperintensity surrounding the calcaneal attachment of the central cord. There is no fascial rupture. There is a small plantar calcaneal spur without reactive marrow edema.  LIGAMENTS  Lateral: The anterior and posterior talofibular and calcaneofibular ligaments are intact.  Medial:  The deltoid and visualized portions of the spring ligament appear intact.  CARTILAGE AND BONES  Ankle Joint: No significant ankle joint effusion. The talar dome and tibial plafond are intact.  Subtalar Joints/Sinus Tarsi: Unremarkable.  Bones: No significant extra-articular osseous findings.  Other: No significant soft tissue findings.  IMPRESSION: 1. Minimal T2 hyperintensity surrounding the calcaneal attachment of the central cord of the plantar fascia. No fascial rupture. 2. Small posterior and plantar calcaneal spurs without reactive marrow edema. 3. The additional ankle tendons and ligaments appear normal. No acute osseous findings.   Electronically Signed   By: Carey Bullocks M.D.   On: 12/13/2019 09:53    Assessment:   1. Acquired Haglund's deformity of right heel   2. 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 Gastrocnemius equinus of right lower extremity   4. Pain of right heel      Plan:  Patient was evaluated and treated and all questions answered.   Discussed etiology and treatment options of the condition in detail with the patient.  At this point he has failed nonsurgical treatment.  I do not think he has good relief with ultrasound shockwave therapy due to the large spur.  I did discuss with him that we could consider gastric resection and Tenex radiofrequency debridement of the tendon, but again I do not think this would offer him complete relief.  He desired surgical intervention.  Surgically we discussed the following: Resection of the calcaneal spur with reflection of the Achilles tendon, resection of the Haglund's deformity and reanchoring of the Achilles tendon as well as a gastrocnemius recession.  We discussed the risk, benefits, and potential complications or could arise from this as well  as expected postoperative course.  All questions were addressed.  He left the office fully informed.  Informed consent was signed and  reviewed in the office and was sent to the surgery scheduler for scheduling.   Surgical plan:  Procedure: -#1 right lower extremity secondary repair of Achilles tendon with debridement of calcific portions, resection of posterior calcaneal enthesophyte #2 resection Haglund's deformity right calcaneus #3 gastrocnemius recession right lower extremity  Location: -[facility]  Anesthesia plan: -General and lesional anesthesia with regional block  Postoperative pain plan: - Tylenol 1000 mg every 6 hours, ibuprofen 600 mg every 8 hours, gabapentin 300 mg every 8 hours x5 days, oxycodone 5 mg 1-2 tabs every 6 hours only as needed  DVT prophylaxis: -ASA 325 mg twice daily  WB Restrictions / DME needs: -NWB in posterior splint after surgery, will transition to boot postoperatively

## 2019-12-16 ENCOUNTER — Other Ambulatory Visit: Payer: 59

## 2019-12-17 ENCOUNTER — Other Ambulatory Visit: Payer: Self-pay

## 2019-12-17 DIAGNOSIS — Z0000 Encounter for general adult medical examination without abnormal findings: Secondary | ICD-10-CM

## 2019-12-18 LAB — MICROALBUMIN / CREATININE URINE RATIO
Creatinine, Urine: 105 mg/dL
Microalb/Creat Ratio: 4 mg/g creat (ref 0–29)
Microalbumin, Urine: 3.7 ug/mL

## 2019-12-18 LAB — LIPID PANEL
Chol/HDL Ratio: 5.6 ratio — ABNORMAL HIGH (ref 0.0–5.0)
Cholesterol, Total: 209 mg/dL — ABNORMAL HIGH (ref 100–199)
HDL: 37 mg/dL — ABNORMAL LOW (ref >39)
LDL Chol Calc (NIH): 147 mg/dL — ABNORMAL HIGH (ref 0–99)
Triglycerides: 135 mg/dL (ref 0–149)
VLDL Cholesterol Cal: 25 mg/dL (ref 5–40)

## 2019-12-18 LAB — HGB A1C W/O EAG: Hgb A1c MFr Bld: 7.5 % — ABNORMAL HIGH (ref 4.8–5.6)

## 2019-12-20 ENCOUNTER — Telehealth: Payer: Self-pay

## 2019-12-20 NOTE — Telephone Encounter (Signed)
DOS 12/31/2019  GASTROCNEMIUS RECESS RT- 22449 CALCANEAL OSTEOTOMY RT - 75300 REPAIR ACHILLES TENDON RT - 51102  [employer] EFFECTIVE DATE - 08/12/2018  PLAN DEDUCTIBLE - $2000.00 W/ $1117.35 REMAINING OUT OF POCKET - $5000.00 W/ $6701.41 REMAINING COPAY $0.00 COINSURANCE - 70%  PER AUTOMATED SYSTEM, NO PRECERT IS REQUIRED FOR CPT 03013, [address] [place] [address]  CALL REF # [id number]

## 2019-12-22 NOTE — Progress Notes (Signed)
Review lab results collected on 12/17/19.  Scheduled to have surgery on right ankle next Friday. Will stop taking ASA tomorrow per instructions from surgeon.  AMD

## 2019-12-23 ENCOUNTER — Ambulatory Visit: Payer: Self-pay | Admitting: Nurse Practitioner

## 2019-12-23 ENCOUNTER — Encounter: Payer: Self-pay | Admitting: Nurse Practitioner

## 2019-12-23 ENCOUNTER — Other Ambulatory Visit: Payer: Self-pay

## 2019-12-23 VITALS — BP 121/86 | HR 96 | Temp 99.5°F | Resp 12 | Ht 70.0 in | Wt 260.0 lb

## 2019-12-23 DIAGNOSIS — Z09 Encounter for follow-up examination after completed treatment for conditions other than malignant neoplasm: Secondary | ICD-10-CM

## 2019-12-23 DIAGNOSIS — R7309 Other abnormal glucose: Secondary | ICD-10-CM

## 2019-12-23 MED ORDER — METFORMIN HCL 500 MG PO TABS: 500.0000 mg | ORAL_TABLET | Freq: Two times a day (BID) | ORAL | 3 refills | Status: DC

## 2019-12-23 NOTE — Progress Notes (Signed)
Subjective:     Patient ID: Wayne Hernandez, male   DOB: 06/27/1970, 49 y.o.   MRN: [id number]  HPI Wayne Hernandez is a 49 y.o. male who presents to the [facility] to discuss results of recent lab work. Employee reports he is scheduled for surgery on his right foot next week. He also reports that his wife died yesterday. They had been apart for 11 years but he did see her and he was there for his children. He reports he has not been doing diet and exercise due to stress and the fact that his foot has hurt to bad to do anything except what he has to.   Review of Systems  Constitutional:       Obese   Musculoskeletal: Positive for gait problem (due to heel spur right foot).  All other systems reviewed and are negative.      Objective: BP 121/86 (BP Location: Left Arm, Patient Position: Sitting, Cuff Size: Large)   Pulse 96   Temp 99.5 F (37.5 C) (Oral)   Resp 12   Ht 5\' 10"  (1.778 m)   Wt 260 lb (117.9 kg)   SpO2 96%   BMI 37.31 kg/m     Physical Exam Constitutional:      Appearance: He is obese.  HENT:     Head: Normocephalic.  Eyes:     Conjunctiva/sclera: Conjunctivae normal.  Neck:     Thyroid: No thyroid mass or thyroid tenderness.     Vascular: Normal carotid pulses. No carotid bruit or JVD.     Trachea: Trachea normal.  Cardiovascular:     Rate and Rhythm: Normal rate and regular rhythm.  Pulmonary:     Effort: Pulmonary effort is normal.     Breath sounds: Normal breath sounds.  Abdominal:     Tenderness: There is no abdominal tenderness. There is no right CVA tenderness or left CVA tenderness.  Musculoskeletal:     Cervical back: Normal range of motion. No spinous process tenderness.  Neurological:     Mental Status: He is alert.  Psychiatric:        Mood and Affect: Mood normal.        Recent Results (from the past 2160 hour(s))  Lipid panel     Status: Abnormal   Collection Time: 12/17/19  8:14 AM  Result Value Ref Range   Cholesterol, Total 209  (H) 100 - 199 mg/dL   Triglycerides 13/05/21 0 - 149 mg/dL   HDL 37 (L) 628 mg/dL   VLDL Cholesterol Cal 25 5 - 40 mg/dL   LDL Chol Calc (NIH) >31 (H) 0 - 99 mg/dL   Chol/HDL Ratio 5.6 (H) 0.0 - 5.0 ratio    Comment:                                   T. Chol/HDL Ratio                                             Men  Women                               1/2 Avg.Risk  3.4    3.3  Avg.Risk  5.0    4.4                                2X Avg.Risk  9.6    7.1                                3X Avg.Risk 23.4   11.0   Hgb A1c w/o eAG     Status: Abnormal   Collection Time: 12/17/19  8:14 AM  Result Value Ref Range   Hgb A1c MFr Bld 7.5 (H) 4.8 - 5.6 %    Comment:          Prediabetes: 5.7 - 6.4          Diabetes: >6.4          Glycemic control for adults with diabetes: <7.0   Microalbumin / creatinine urine ratio     Status: None   Collection Time: 12/17/19  8:14 AM  Result Value Ref Range   Creatinine, Urine 105.0 Not Estab. mg/dL   Microalbumin, Urine 3.7 Not Estab. ug/mL   Microalb/Creat Ratio 4 0 - 29 mg/g creat    Comment:                        Normal:                0 -  29                        Moderately increased: 30 - 300                        Severely increased:       >300      Assessment:    49 y.o. male here for lab results. Discussed results of A1c and Cholesterol and need for treatment.     Plan:    Will start Metformin 500 mg PO BID. Discussed in detail with the employee need for healthy diet and exercise plan.Information given with d/c instructions. Employee agrees with plan. He will let his surgeon know about the results today and the new medication. He will RTC in 3 months for f/u and sooner for problems.

## 2019-12-23 NOTE — Patient Instructions (Signed)
Diabetes Basics  Diabetes (diabetes mellitus) is a long-term (chronic) disease. It occurs when the body does not properly use sugar (glucose) that is released from food after you eat. Diabetes may be caused by one or both of these problems:  Your pancreas does not make enough of a hormone called insulin.  Your body does not react in a normal way to insulin that it makes. Insulin lets sugars (glucose) go into cells in your body. This gives you energy. If you have diabetes, sugars cannot get into cells. This causes high blood sugar (hyperglycemia). Follow these instructions at home: How is diabetes treated? You may need to take insulin or other diabetes medicines daily to keep your blood sugar in balance. Take your diabetes medicines every day as told by your doctor. List your diabetes medicines here: Diabetes medicines  Name of medicine: ______________________________ ? Amount (dose): _______________ Time (a.m./p.m.): _______________ Notes: ___________________________________  Name of medicine: ______________________________ ? Amount (dose): _______________ Time (a.m./p.m.): _______________ Notes: ___________________________________  Name of medicine: ______________________________ ? Amount (dose): _______________ Time (a.m./p.m.): _______________ Notes: ___________________________________ If you use insulin, you will learn how to give yourself insulin by injection. You may need to adjust the amount based on the food that you eat. List the types of insulin you use here: Insulin  Insulin type: ______________________________ ? Amount (dose): _______________ Time (a.m./p.m.): _______________ Notes: ___________________________________  Insulin type: ______________________________ ? Amount (dose): _______________ Time (a.m./p.m.): _______________ Notes: ___________________________________  Insulin type: ______________________________ ? Amount (dose): _______________ Time (a.m./p.m.):  _______________ Notes: ___________________________________  Insulin type: ______________________________ ? Amount (dose): _______________ Time (a.m./p.m.): _______________ Notes: ___________________________________  Insulin type: ______________________________ ? Amount (dose): _______________ Time (a.m./p.m.): _______________ Notes: ___________________________________ How do I manage my blood sugar?  Check your blood sugar levels using a blood glucose monitor as directed by your doctor. Your doctor will set treatment goals for you. Generally, you should have these blood sugar levels:  Before meals (preprandial): 80-130 mg/dL (4.4-7.2 mmol/L).  After meals (postprandial): below 180 mg/dL (10 mmol/L).  A1c level: less than 7%. Write down the times that you will check your blood sugar levels: Blood sugar checks  Time: _______________ Notes: ___________________________________  Time: _______________ Notes: ___________________________________  Time: _______________ Notes: ___________________________________  Time: _______________ Notes: ___________________________________  Time: _______________ Notes: ___________________________________  Time: _______________ Notes: ___________________________________  What do I need to know about low blood sugar? Low blood sugar is called hypoglycemia. This is when blood sugar is at or below 70 mg/dL (3.9 mmol/L). Symptoms may include:  Feeling: ? Hungry. ? Worried or nervous (anxious). ? Sweaty and clammy. ? Confused. ? Dizzy. ? Sleepy. ? Sick to your stomach (nauseous).  Having: ? A fast heartbeat. ? A headache. ? A change in your vision. ? Tingling or no feeling (numbness) around the mouth, lips, or tongue. ? Jerky movements that you cannot control (seizure).  Having trouble with: ? Moving (coordination). ? Sleeping. ? Passing out (fainting). ? Getting upset easily (irritability). Treating low blood sugar To treat low blood  sugar, eat or drink something sugary right away. If you can think clearly and swallow safely, follow the 15:15 rule:  Take 15 grams of a fast-acting carb (carbohydrate). Talk with your doctor about how much you should take.  Some fast-acting carbs are: ? Sugar tablets (glucose pills). Take 3-4 glucose pills. ? 6-8 pieces of hard candy. ? 4-6 oz (120-150 mL) of fruit juice. ? 4-6 oz (120-150 mL) of regular (not diet) soda. ? 1 Tbsp (15 mL) honey or sugar.  Check your blood sugar 15 minutes after you take the carb.  If your blood sugar is still at or below 70 mg/dL (3.9 mmol/L), take 15 grams of a carb again.  If your blood sugar does not go above 70 mg/dL (3.9 mmol/L) after 3 tries, get help right away.  After your blood sugar goes back to normal, eat a meal or a snack within 1 hour. Treating very low blood sugar If your blood sugar is at or below 54 mg/dL (3 mmol/L), you have very low blood sugar (severe hypoglycemia). This is an emergency. Do not wait to see if the symptoms will go away. Get medical help right away. Call your local emergency services (911 in the [place] Do not drive yourself to the hospital. Questions to ask your health care provider  Do I need to meet with a diabetes educator?  What equipment will I need to care for myself at home?  What diabetes medicines do I need? When should I take them?  How often do I need to check my blood sugar?  What number can I call if I have questions?  When is my next doctor's visit?  Where can I find a support group for people with diabetes? Where to find more information  American Diabetes Association: www.diabetes.org  American Association of Diabetes Educators: www.diabeteseducator.org/patient-resources Contact a doctor if:  Your blood sugar is at or above 240 mg/dL (82.7 mmol/L) for 2 days in a row.  You have been sick or have had a fever for 2 days or more, and you are not getting better.  You have any of these  problems for more than 6 hours: ? You cannot eat or drink. ? You feel sick to your stomach (nauseous). ? You throw up (vomit). ? You have watery poop (diarrhea). Get help right away if:  Your blood sugar is lower than 54 mg/dL (3 mmol/L).  You get confused.  You have trouble: ? Thinking clearly. ? Breathing. Summary  Diabetes (diabetes mellitus) is a long-term (chronic) disease. It occurs when the body does not properly use sugar (glucose) that is released from food after digestion.  Take insulin and diabetes medicines as told.  Check your blood sugar every day, as often as told.  Keep all follow-up visits as told by your doctor. This is important. This information is not intended to replace advice given to you by your health care provider. Make sure you discuss any questions you have with your health care provider. Document Revised: 10/21/2018 Document Reviewed: 05/02/2017 Elsevier Patient Education  2020 [employer]  Type 2 Diabetes Mellitus, Diagnosis, Adult Type 2 diabetes (type 2 diabetes mellitus) is a long-term (chronic) disease. It may be caused by one or both of these problems:  Your pancreas does not make enough of a hormone called insulin.  Your body does not react in a normal way to insulin that it makes. Insulin lets sugars (glucose) go into cells in your body. This gives you energy. If you have type 2 diabetes, sugars cannot get into cells. This causes high blood sugar (hyperglycemia). Your doctor will set treatment goals for you. Generally, you should have these blood sugar levels:  Before meals (preprandial): 80-130 mg/dL (0.7-8.6 mmol/L).  After meals (postprandial): below 180 mg/dL (10 mmol/L).  A1c (hemoglobin A1c) level: less than 7%. Follow these instructions at home: Questions to ask your doctor  You may want to ask these questions: ? Do I need to meet with a diabetes educator? ? Where  can I find a support group for people with diabetes? ? What  equipment will I need to care for myself at home? ? What diabetes medicines do I need? When should I take them? ? How often do I need to check my blood sugar? ? What number can I call if I have questions? ? When is my next doctor's visit? General instructions  Take over-the-counter and prescription medicines only as told by your doctor.  Keep all follow-up visits as told by your doctor. This is important. Contact a doctor if:  Your blood sugar is at or above 240 mg/dL (16.1 mmol/L) for 2 days in a row.  You have been sick for 2 days or more, and you are not getting better.  You have had a fever for 2 days or more, and you are not getting better.  You have any of these problems for more than 6 hours: ? You cannot eat or drink. ? You feel sick to your stomach (nauseous). ? You throw up (vomit). ? You have watery poop (diarrhea). Get help right away if:  Your blood sugar is lower than 54 mg/dL (3 mmol/L).  You get confused.  You have trouble: ? Thinking clearly. ? Breathing.  You have moderate or large ketone levels in your pee (urine). Summary  Type 2 diabetes is a long-term (chronic) disease. Your pancreas may not make enough of a hormone called insulin, or your body may not react normally to insulin that it makes.  Take over-the-counter and prescription medicines only as told by your doctor.  Keep all follow-up visits as told by your doctor. This is important. This information is not intended to replace advice given to you by your health care provider. Make sure you discuss any questions you have with your health care provider. Document Revised: 03/28/2017 Document Reviewed: 03/03/2015 Elsevier Patient Education  2020 [employer] Inc.  Carbohydrate Counting for Diabetes Mellitus, Adult  Carbohydrate counting is a method of keeping track of how many carbohydrates you eat. Eating carbohydrates naturally increases the amount of sugar (glucose) in the blood. Counting how many  carbohydrates you eat helps keep your blood glucose within normal limits, which helps you manage your diabetes (diabetes mellitus). It is important to know how many carbohydrates you can safely have in each meal. This is different for every person. A diet and nutrition specialist (registered dietitian) can help you make a meal plan and calculate how many carbohydrates you should have at each meal and snack. Carbohydrates are found in the following foods:  Grains, such as breads and cereals.  Dried beans and soy products.  Starchy vegetables, such as potatoes, peas, and corn.  Fruit and fruit juices.  Milk and yogurt.  Sweets and snack foods, such as cake, cookies, candy, chips, and soft drinks. How do I count carbohydrates? There are two ways to count carbohydrates in food. You can use either of the methods or a combination of both. Reading "Nutrition Facts" on packaged food The "Nutrition Facts" list is included on the labels of almost all packaged foods and beverages in the [place] It includes:  The serving size.  Information about nutrients in each serving, including the grams (g) of carbohydrate per serving. To use the Nutrition Facts":  Decide how many servings you will have.  Multiply the number of servings by the number of carbohydrates per serving.  The resulting number is the total amount of carbohydrates that you will be having. Learning standard serving sizes of other foods  When you eat carbohydrate foods that are not packaged or do not include "Nutrition Facts" on the label, you need to measure the servings in order to count the amount of carbohydrates:  Measure the foods that you will eat with a food scale or measuring cup, if needed.  Decide how many standard-size servings you will eat.  Multiply the number of servings by 15. Most carbohydrate-rich foods have about 15 g of carbohydrates per serving. ? For example, if you eat 8 oz (170 g) of strawberries, you will  have eaten 2 servings and 30 g of carbohydrates (2 servings x 15 g = 30 g).  For foods that have more than one food mixed, such as soups and casseroles, you must count the carbohydrates in each food that is included. The following list contains standard serving sizes of common carbohydrate-rich foods. Each of these servings has about 15 g of carbohydrates:   hamburger bun or  English muffin.   oz (15 mL) syrup.   oz (14 g) jelly.  1 slice of bread.  1 six-inch tortilla.  3 oz (85 g) cooked rice or pasta.  4 oz (113 g) cooked dried beans.  4 oz (113 g) starchy vegetable, such as peas, corn, or potatoes.  4 oz (113 g) hot cereal.  4 oz (113 g) mashed potatoes or  of a large baked potato.  4 oz (113 g) canned or frozen fruit.  4 oz (120 mL) fruit juice.  4-6 crackers.  6 chicken nuggets.  6 oz (170 g) unsweetened dry cereal.  6 oz (170 g) plain fat-free yogurt or yogurt sweetened with artificial sweeteners.  8 oz (240 mL) milk.  8 oz (170 g) fresh fruit or one small piece of fruit.  24 oz (680 g) popped popcorn. Example of carbohydrate counting Sample meal  3 oz (85 g) chicken breast.  6 oz (170 g) brown rice.  4 oz (113 g) corn.  8 oz (240 mL) milk.  8 oz (170 g) strawberries with sugar-free whipped topping. Carbohydrate calculation 1. Identify the foods that contain carbohydrates: ? Rice. ? Corn. ? Milk. ? Strawberries. 2. Calculate how many servings you have of each food: ? 2 servings rice. ? 1 serving corn. ? 1 serving milk. ? 1 serving strawberries. 3. Multiply each number of servings by 15 g: ? 2 servings rice x 15 g = 30 g. ? 1 serving corn x 15 g = 15 g. ? 1 serving milk x 15 g = 15 g. ? 1 serving strawberries x 15 g = 15 g. 4. Add together all of the amounts to find the total grams of carbohydrates eaten: ? 30 g + 15 g + 15 g + 15 g = 75 g of carbohydrates total. Summary  Carbohydrate counting is a method of keeping track of how  many carbohydrates you eat.  Eating carbohydrates naturally increases the amount of sugar (glucose) in the blood.  Counting how many carbohydrates you eat helps keep your blood glucose within normal limits, which helps you manage your diabetes.  A diet and nutrition specialist (registered dietitian) can help you make a meal plan and calculate how many carbohydrates you should have at each meal and snack. This information is not intended to replace advice given to you by your health care provider. Make sure you discuss any questions you have with your health care provider. Document Revised: 08/22/2016 Document Reviewed: 07/12/2015 Elsevier Patient Education  2020 [employer].  Diabetes Mellitus and Exercise  Exercising regularly is important for your overall health, especially when you have diabetes (diabetes mellitus). Exercising is not only about losing weight. It has many other health benefits, such as increasing muscle strength and bone density and reducing body fat and stress. This leads to improved fitness, flexibility, and endurance, all of which result in better overall health. Exercise has additional benefits for people with diabetes, including:  Reducing appetite.  Helping to lower and control blood glucose.  Lowering blood pressure.  Helping to control amounts of fatty substances (lipids) in the blood, such as cholesterol and triglycerides.  Helping the body to respond better to insulin (improving insulin sensitivity).  Reducing how much insulin the body needs.  Decreasing the risk for heart disease by: ? Lowering cholesterol and triglyceride levels. ? Increasing the levels of good cholesterol. ? Lowering blood glucose levels. What is my activity plan? Your health care provider or certified diabetes educator can help you make a plan for the type and frequency of exercise (activity plan) that works for you. Make sure that you:  Do at least 150 minutes of moderate-intensity  or vigorous-intensity exercise each week. This could be brisk walking, biking, or water aerobics. ? Do stretching and strength exercises, such as yoga or weightlifting, at least 2 times a week. ? Spread out your activity over at least 3 days of the week.  Get some form of physical activity every day. ? Do not go more than 2 days in a row without some kind of physical activity. ? Avoid being inactive for more than 30 minutes at a time. Take frequent breaks to walk or stretch.  Choose a type of exercise or activity that you enjoy, and set realistic goals.  Start slowly, and gradually increase the intensity of your exercise over time. What do I need to know about managing my diabetes?   Check your blood glucose before and after exercising. ? If your blood glucose is 240 mg/dL (16.113.3 mmol/L) or higher before you exercise, check your urine for ketones. If you have ketones in your urine, do not exercise until your blood glucose returns to normal. ? If your blood glucose is 100 mg/dL (5.6 mmol/L) or lower, eat a snack containing 15-20 grams of carbohydrate. Check your blood glucose 15 minutes after the snack to make sure that your level is above 100 mg/dL (5.6 mmol/L) before you start your exercise.  Know the symptoms of low blood glucose (hypoglycemia) and how to treat it. Your risk for hypoglycemia increases during and after exercise. Common symptoms of hypoglycemia can include: ? Hunger. ? Anxiety. ? Sweating and feeling clammy. ? Confusion. ? Dizziness or feeling light-headed. ? Increased heart rate or palpitations. ? Blurry vision. ? Tingling or numbness around the mouth, lips, or tongue. ? Tremors or shakes. ? Irritability.  Keep a rapid-acting carbohydrate snack available before, during, and after exercise to help prevent or treat hypoglycemia.  Avoid injecting insulin into areas of the body that are going to be exercised. For example, avoid injecting insulin into: ? The arms, when  playing tennis. ? The legs, when jogging.  Keep records of your exercise habits. Doing this can help you and your health care provider adjust your diabetes management plan as needed. Write down: ? Food that you eat before and after you exercise. ? Blood glucose levels before and after you exercise. ? The type and amount of exercise you have done. ? When your insulin is expected to peak, if you use insulin. Avoid exercising  at times when your insulin is peaking.  When you start a new exercise or activity, work with your health care provider to make sure the activity is safe for you, and to adjust your insulin, medicines, or food intake as needed.  Drink plenty of water while you exercise to prevent dehydration or heat stroke. Drink enough fluid to keep your urine clear or pale yellow. Summary  Exercising regularly is important for your overall health, especially when you have diabetes (diabetes mellitus).  Exercising has many health benefits, such as increasing muscle strength and bone density and reducing body fat and stress.  Your health care provider or certified diabetes educator can help you make a plan for the type and frequency of exercise (activity plan) that works for you.  When you start a new exercise or activity, work with your health care provider to make sure the activity is safe for you, and to adjust your insulin, medicines, or food intake as needed. This information is not intended to replace advice given to you by your health care provider. Make sure you discuss any questions you have with your health care provider. Document Revised: 08/22/2016 Document Reviewed: 07/10/2015 Elsevier Patient Education  2020 [employer] Inc.  High Cholesterol  High cholesterol is a condition in which the blood has high levels of a white, waxy, fat-like substance (cholesterol). The human body needs small amounts of cholesterol. The liver makes all the cholesterol that the body needs. Extra  (excess) cholesterol comes from the food that we eat. Cholesterol is carried from the liver by the blood through the blood vessels. If you have high cholesterol, deposits (plaques) may build up on the walls of your blood vessels (arteries). Plaques make the arteries narrower and stiffer. Cholesterol plaques increase your risk for heart attack and stroke. Work with your health care provider to keep your cholesterol levels in a healthy range. What increases the risk? This condition is more likely to develop in people who:  Eat foods that are high in animal fat (saturated fat) or cholesterol.  Are overweight.  Are not getting enough exercise.  Have a family history of high cholesterol. What are the signs or symptoms? There are no symptoms of this condition. How is this diagnosed? This condition may be diagnosed from the results of a blood test.  If you are older than age 41, your health care provider may check your cholesterol every 4-6 years.  You may be checked more often if you already have high cholesterol or other risk factors for heart disease. The blood test for cholesterol measures:  "Bad" cholesterol (LDL cholesterol). This is the main type of cholesterol that causes heart disease. The desired level for LDL is less than 100.  "Good" cholesterol (HDL cholesterol). This type helps to protect against heart disease by cleaning the arteries and carrying the LDL away. The desired level for HDL is 60 or higher.  Triglycerides. These are fats that the body can store or burn for energy. The desired number for triglycerides is lower than 150.  Total cholesterol. This is a measure of the total amount of cholesterol in your blood, including LDL cholesterol, HDL cholesterol, and triglycerides. A healthy number is less than 200. How is this treated? This condition is treated with diet changes, lifestyle changes, and medicines. Diet changes  This may include eating more whole grains, fruits,  vegetables, nuts, and fish.  This may also include cutting back on red meat and foods that have a lot of added  sugar. Lifestyle changes  Changes may include getting at least 40 minutes of aerobic exercise 3 times a week. Aerobic exercises include walking, biking, and swimming. Aerobic exercise along with a healthy diet can help you maintain a healthy weight.  Changes may also include quitting smoking. Medicines  Medicines are usually given if diet and lifestyle changes have failed to reduce your cholesterol to healthy levels.  Your health care provider may prescribe a statin medicine. Statin medicines have been shown to reduce cholesterol, which can reduce the risk of heart disease. Follow these instructions at home: Eating and drinking If told by your health care provider:  Eat chicken (without skin), fish, veal, shellfish, ground [place] breast, and round or loin cuts of red meat.  Do not eat fried foods or fatty meats, such as hot dogs and salami.  Eat plenty of fruits, such as apples.  Eat plenty of vegetables, such as broccoli, potatoes, and carrots.  Eat beans, peas, and lentils.  Eat grains such as barley, rice, couscous, and bulgur wheat.  Eat pasta without cream sauces.  Use skim or nonfat milk, and eat low-fat or nonfat yogurt and cheeses.  Do not eat or drink whole milk, cream, ice cream, egg yolks, or hard cheeses.  Do not eat stick margarine or tub margarines that contain trans fats (also called partially hydrogenated oils).  Do not eat saturated tropical oils, such as coconut oil and palm oil.  Do not eat cakes, cookies, crackers, or other baked goods that contain trans fats.  General instructions  Exercise as directed by your health care provider. Increase your activity level with activities such as gardening, walking, and taking the stairs.  Take over-the-counter and prescription medicines only as told by your health care provider.  Do not use any  products that contain nicotine or tobacco, such as cigarettes and e-cigarettes. If you need help quitting, ask your health care provider.  Keep all follow-up visits as told by your health care provider. This is important. Contact a health care provider if:  You are struggling to maintain a healthy diet or weight.  You need help to start on an exercise program.  You need help to stop smoking. Get help right away if:  You have chest pain.  You have trouble breathing. This information is not intended to replace advice given to you by your health care provider. Make sure you discuss any questions you have with your health care provider.  It is important to start a healthy diet and exercise plan to help improve your overall health. Your blood work shows your cholesterol is elevated and your Hgb. A1c indicates type 2 diabetes. You will need to return in 3 months for repeat labs. Let your foot doctor know your lab results and our plan.  Document Revised: 01/31/2017 Document Reviewed: 07/29/2015 Elsevier Patient Education  2020 [employer].

## 2019-12-24 DIAGNOSIS — M79676 Pain in unspecified toe(s): Secondary | ICD-10-CM

## 2019-12-31 ENCOUNTER — Other Ambulatory Visit: Payer: Self-pay | Admitting: Podiatry

## 2019-12-31 DIAGNOSIS — M25571 Pain in right ankle and joints of right foot: Secondary | ICD-10-CM | POA: Diagnosis not present

## 2019-12-31 DIAGNOSIS — M62461 Contracture of muscle, right lower leg: Secondary | ICD-10-CM | POA: Diagnosis not present

## 2019-12-31 DIAGNOSIS — M9261 Juvenile osteochondrosis of tarsus, right ankle: Secondary | ICD-10-CM | POA: Diagnosis not present

## 2019-12-31 DIAGNOSIS — M65271 Calcific tendinitis, right ankle and foot: Secondary | ICD-10-CM | POA: Diagnosis not present

## 2019-12-31 DIAGNOSIS — M7661 Achilles tendinitis, right leg: Secondary | ICD-10-CM | POA: Diagnosis not present

## 2019-12-31 DIAGNOSIS — M216X1 Other acquired deformities of right foot: Secondary | ICD-10-CM | POA: Diagnosis not present

## 2019-12-31 DIAGNOSIS — M25774 Osteophyte, right foot: Secondary | ICD-10-CM | POA: Diagnosis not present

## 2019-12-31 DIAGNOSIS — M898X7 Other specified disorders of bone, ankle and foot: Secondary | ICD-10-CM | POA: Diagnosis not present

## 2019-12-31 HISTORY — PX: FOOT SURGERY: SHX648

## 2019-12-31 MED ORDER — IBUPROFEN 600 MG PO TABS: 600.0000 mg | ORAL_TABLET | Freq: Three times a day (TID) | ORAL | 0 refills | Status: AC | PRN

## 2019-12-31 MED ORDER — GABAPENTIN 300 MG PO CAPS: 300.0000 mg | ORAL_CAPSULE | Freq: Three times a day (TID) | ORAL | 0 refills | Status: DC

## 2019-12-31 MED ORDER — OXYCODONE HCL 5 MG PO TABS
5.0000 mg | ORAL_TABLET | ORAL | 0 refills | Status: AC | PRN
Start: 2019-12-31 — End: 2020-01-07

## 2019-12-31 MED ORDER — ACETAMINOPHEN 500 MG PO TABS: 1000.0000 mg | ORAL_TABLET | Freq: Four times a day (QID) | ORAL | 0 refills | Status: AC | PRN

## 2019-12-31 MED ORDER — ASPIRIN EC 325 MG PO TBEC: 325.0000 mg | DELAYED_RELEASE_TABLET | Freq: Two times a day (BID) | ORAL | 0 refills | Status: AC

## 2019-12-31 NOTE — Progress Notes (Signed)
12/31/19 [facility] R lower extremity Achilles debridement, spur resection, Haglund's deformity resection, gastrocnemius recession   He was recently diagnosed with diabetes with an A1c 7.5% and placed on metformin. I discussed with him that it may be better to delay surgery until he gets below 7%, 8% is my absolute cutoff for elective surgery. We discussed risks of wound healing complications and infection and that this could arise from this, he understands that risk and wishes to proceed as planned.

## 2020-01-10 ENCOUNTER — Ambulatory Visit (INDEPENDENT_AMBULATORY_CARE_PROVIDER_SITE_OTHER): Payer: 59 | Admitting: Podiatry

## 2020-01-10 ENCOUNTER — Encounter: Payer: Self-pay | Admitting: Podiatry

## 2020-01-10 ENCOUNTER — Ambulatory Visit (INDEPENDENT_AMBULATORY_CARE_PROVIDER_SITE_OTHER): Payer: 59

## 2020-01-10 DIAGNOSIS — Z9889 Other specified postprocedural states: Secondary | ICD-10-CM

## 2020-01-10 DIAGNOSIS — M9261 Juvenile osteochondrosis of tarsus, right ankle: Secondary | ICD-10-CM

## 2020-01-10 NOTE — Progress Notes (Signed)
  Subjective:  Patient ID: Wayne Hernandez, male    DOB: 01/26/1971,  MRN: [id number]  Chief Complaint  Patient presents with  . Routine Post Op    POV #1 DOS 12/31/2019 DEBRIDEMENT OF ACHILLES TENDON, RESECTION OF HEEL SPUR, HAGLAND'S DEFORMITY RESECTION, GASTROCMEIUS RECESSION, RT FOOT    DOS: 12/31/2019 Procedure: Debride Achilles tendon, resection heel spur posterior, Haglund's resection, gastrocnemius recession  49 y.o. male returns for post-op check.  Doing well.  Minimal pain.  Only taking Tylenol and ibuprofen.  He is taking aspirin once a day currently.  Here with his father.  Has been nonweightbearing in the splint.  Review of Systems: Negative except as noted in the HPI. Denies N/V/F/Ch.   Objective:  There were no vitals filed for this visit. There is no height or weight on file to calculate BMI. Constitutional Well developed. Well nourished.  Vascular Foot warm and well perfused. Capillary refill normal to all digits.   Neurologic Normal speech. Oriented to person, place, and time. Epicritic sensation to light touch grossly present bilaterally.  Dermatologic Skin healing well without signs of infection. Skin edges well coapted without signs of infection.  Splint and dressings both clean dry and intact  Orthopedic: Tenderness to palpation noted about the surgical site.   Radiographs: Right foot nonweightbearing films: Interval resection of posterior calcaneal enthesophyte and Haglund's deformity Assessment:   1. Acquired Haglund's deformity of right heel   2. S/P foot surgery, right    Plan:  Patient was evaluated and treated and all questions answered.  S/p foot surgery right -Progressing as expected post-operatively. -XR: Consistent postoperative changes -WB Status: NWB in posterior below-knee splint which was reapplied today -Sutures: We will leave intact till next visit, staples will stay intact for 3 more weeks. -Medications: No refills required.  I  encouraged him to take aspirin twice daily 325 mg -Foot redressed with sterile dressing and an Ace wrap under light compression.  Well-padded below the knee splint was also applied.  Return in about 9 days (around 01/19/2020).

## 2020-01-10 NOTE — Patient Instructions (Signed)
Take aspirin 325mg  twice daily. Monitor for signs of a blood clot such as severe pain or swelling in the calf, chest pain, or shortness of breath. If this develops, notify the office and go to the emergency room or call 911.

## 2020-01-19 ENCOUNTER — Other Ambulatory Visit: Payer: Self-pay

## 2020-01-19 ENCOUNTER — Encounter: Payer: Self-pay | Admitting: Podiatry

## 2020-01-19 ENCOUNTER — Ambulatory Visit (INDEPENDENT_AMBULATORY_CARE_PROVIDER_SITE_OTHER): Payer: 59 | Admitting: Podiatry

## 2020-01-19 DIAGNOSIS — M21861 Other specified acquired deformities of right lower leg: Secondary | ICD-10-CM

## 2020-01-19 DIAGNOSIS — M216X1 Other acquired deformities of right foot: Secondary | ICD-10-CM

## 2020-01-19 DIAGNOSIS — Z9889 Other specified postprocedural states: Secondary | ICD-10-CM

## 2020-01-19 DIAGNOSIS — M9261 Juvenile osteochondrosis of tarsus, right ankle: Secondary | ICD-10-CM

## 2020-01-19 DIAGNOSIS — M62461 Contracture of muscle, right lower leg: Secondary | ICD-10-CM

## 2020-01-19 DIAGNOSIS — M6528 Calcific tendinitis, other site: Secondary | ICD-10-CM

## 2020-01-19 DIAGNOSIS — M7661 Achilles tendinitis, right leg: Secondary | ICD-10-CM

## 2020-01-20 ENCOUNTER — Telehealth: Payer: Self-pay

## 2020-01-20 MED ORDER — CEFDINIR 300 MG PO CAPS: 300.0000 mg | ORAL_CAPSULE | Freq: Two times a day (BID) | ORAL | 0 refills | Status: AC

## 2020-01-20 NOTE — Telephone Encounter (Signed)
Patient has been contacted and informed that medication has been sent to pharmacy

## 2020-01-20 NOTE — Telephone Encounter (Signed)
-----   Message from Edwin Cap, DPM sent at 01/20/2020 11:47 AM EST ----- Regarding: RE: pt requesting antibiotic I just sent. Thank you! ----- Message ----- From: Adela Ports Sent: 01/20/2020  11:15 AM EST To: Constance Haw, LPN, Edwin Cap, DPM Subject: pt requesting antibiotic                       Pt stated that at his appt yesterday that he was suppose to have an antibiotic sent it but it is not showing that the request went through. Pharmacy is [facility] [address].

## 2020-01-21 NOTE — Progress Notes (Signed)
  Subjective:  Patient ID: Wayne Hernandez, male    DOB: 11-29-1970,  MRN: [id number]  Chief Complaint  Patient presents with  . Routine Post Op    POV #2 DOS 12/31/2019 DEBRIDEMENT OF ACHILLES TENDON, RESECTION OF HEEL SPUR, HAGLAND'S DEFORMITY RESECTION, GASTROCMEIUS RECESSION, RT FOOT    DOS: 12/31/2019 Procedure: Debride Achilles tendon, resection heel spur posterior, Haglund's resection, gastrocnemius recession  49 y.o. male returns for post-op check.  Doing well.  Minimal pain.  Only taking Tylenol and ibuprofen.  He is taking aspirin once a day currently.  Here with his father.  Has been nonweightbearing in the splint.  Review of Systems: Negative except as noted in the HPI. Denies N/V/F/Ch.   Objective:  There were no vitals filed for this visit. There is no height or weight on file to calculate BMI. Constitutional Well developed. Well nourished.  Vascular Foot warm and well perfused. Capillary refill normal to all digits.   Neurologic Normal speech. Oriented to person, place, and time. Epicritic sensation to light touch grossly present bilaterally.  Dermatologic  today centrally there is some serous drainage with maceration of the incision.  Sutures are intact.  No wound dehiscence no exposed subcutaneous tissue.  Mild periwound erythema.  No cellulitis or purulence or malodor  Orthopedic: Tenderness to palpation noted about the surgical site.   Radiographs: Right foot nonweightbearing films: Interval resection of posterior calcaneal enthesophyte and Haglund's deformity Assessment:   No diagnosis found. Plan:  Patient was evaluated and treated and all questions answered.  S/p foot surgery right -Today with some maceration and erythema.  Prescription for doxycycline x14 days sent to pharmacy -WB Status: NWB in a tall CAM boot with plantarflexion wedges -Sutures: We will leave intact till next visit, staples will stay intact for 2 more weeks. -Incision was painted with  Betadine and redressed with sterile dressing and an Ace wrap under light compression.  No follow-ups on file.

## 2020-01-26 ENCOUNTER — Encounter: Payer: Self-pay | Admitting: Podiatry

## 2020-01-26 ENCOUNTER — Other Ambulatory Visit: Payer: Self-pay

## 2020-01-26 ENCOUNTER — Ambulatory Visit (INDEPENDENT_AMBULATORY_CARE_PROVIDER_SITE_OTHER): Payer: 59 | Admitting: Podiatry

## 2020-01-26 DIAGNOSIS — M216X1 Other acquired deformities of right foot: Secondary | ICD-10-CM

## 2020-01-26 DIAGNOSIS — Z9889 Other specified postprocedural states: Secondary | ICD-10-CM

## 2020-01-26 DIAGNOSIS — M6528 Calcific tendinitis, other site: Secondary | ICD-10-CM

## 2020-01-26 DIAGNOSIS — M21861 Other specified acquired deformities of right lower leg: Secondary | ICD-10-CM

## 2020-01-26 DIAGNOSIS — M9261 Juvenile osteochondrosis of tarsus, right ankle: Secondary | ICD-10-CM

## 2020-01-26 MED ORDER — MUPIROCIN 2 % EX OINT: 1.0000 "application " | TOPICAL_OINTMENT | Freq: Every day | CUTANEOUS | 2 refills | Status: DC

## 2020-01-26 NOTE — Patient Instructions (Signed)
Change the dressing every 2-3 days with mupirocin ointment and gauze dressings  Continue the antibiotics until finished   Monitor for any signs/symptoms of infection. Signs of an infection could be redness beyond the site of the incision/procedure/wound, foul smelling odor, drainage that is thick and yellow or green, or severe swelling and pain. Call the office immediately if any occur or go directly to the emergency room. Call with any questions/concerns.

## 2020-01-26 NOTE — Progress Notes (Signed)
  Subjective:  Patient ID: Wayne Hernandez, male    DOB: 09-08-1970,  MRN: [id number]  Chief Complaint  Patient presents with  . Routine Post Op    DOS 12/31/2019 DEBRIDEMENT OF ACHILLES TENDON, RESECTION OF HEEL SPUR, HAGLAND'S DEFORMITY RESECTION, GASTROCMEIUS RECESSION, RT FOOT    DOS: 12/31/2019 Procedure: Debride Achilles tendon, resection heel spur posterior, Haglund's resection, gastrocnemius recession  49 y.o. male returns for post-op check.  Doing well.  No pain.  No longer taking Tylenol and ibuprofen.  He is taking aspirin twice a day currently.  Here with his father.  Has been nonweightbearing in the splint.  Review of Systems: Negative except as noted in the HPI. Denies N/V/F/Ch.   Objective:  There were no vitals filed for this visit. There is no height or weight on file to calculate BMI. Constitutional Well developed. Well nourished.  Vascular Foot warm and well perfused. Capillary refill normal to all digits.   Neurologic Normal speech. Oriented to person, place, and time. Epicritic sensation to light touch grossly present bilaterally.  Dermatologic  proximal calf incision is well-healed.  Anterior incision distally and proximally is well-healed with central area of delayed healing and superficial dehiscence  Orthopedic: Tenderness to palpation noted about the surgical site.   Radiographs: Right foot nonweightbearing films: Interval resection of posterior calcaneal enthesophyte and Haglund's deformity Assessment:   1. Acquired Haglund's deformity of right heel   2. 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 Gastrocnemius equinus of right lower extremity   4. S/P foot surgery, right    Plan:  Patient was evaluated and treated and all questions answered.  S/p foot surgery right -Today with some maceration and erythema.  Prescription for Omnicef x14 days sent to pharmacy -WB Status: NWB in a tall CAM boot with plantarflexion wedges -Sutures:  Removed proximal staples and sutures, the left inferior staples and sutures remain intact -Foot was redressed with Iodosorb ointment and dry sterile gauze dressing.  Would like to change this every other day with mupirocin ointment which I prescribed for them  Return in about 1 week (around 02/02/2020).

## 2020-02-02 ENCOUNTER — Ambulatory Visit (INDEPENDENT_AMBULATORY_CARE_PROVIDER_SITE_OTHER): Payer: 59 | Admitting: Podiatry

## 2020-02-02 ENCOUNTER — Encounter: Payer: Self-pay | Admitting: Podiatry

## 2020-02-02 ENCOUNTER — Other Ambulatory Visit: Payer: Self-pay

## 2020-02-02 DIAGNOSIS — T8131XA Disruption of external operation (surgical) wound, not elsewhere classified, initial encounter: Secondary | ICD-10-CM | POA: Diagnosis not present

## 2020-02-02 MED ORDER — SULFAMETHOXAZOLE-TRIMETHOPRIM 800-160 MG PO TABS: 1.0000 | ORAL_TABLET | Freq: Two times a day (BID) | ORAL | 0 refills | Status: AC

## 2020-02-02 NOTE — Patient Instructions (Addendum)
Change every other day with mupirocin ointment and gauze dressings

## 2020-02-04 NOTE — Progress Notes (Signed)
°  Subjective:  Patient ID: Wayne Hernandez, male    DOB: [birth year],  MRN: [id number]  Chief Complaint  Patient presents with   Routine Post Op    DOS 12/31/2019 DEBRIDEMENT OF ACHILLES TENDON, RESECTION OF HEEL SPUR, HAGLAND'S DEFORMITY RESECTION, GASTROCMEIUS RECESSION, RT FOOT    DOS: 12/31/2019 Procedure: Debride Achilles tendon, resection heel spur posterior, Haglund's resection, gastrocnemius recession  49 y.o. male returns for post-op check. Still has little to no pain. Has been taking the Omnicef. Has been nonweightbearing in the boot.  Review of Systems: Negative except as noted in the HPI. Denies N/V/F/Ch.   Objective:  There were no vitals filed for this visit. There is no height or weight on file to calculate BMI. Constitutional Well developed. Well nourished.  Vascular Foot warm and well perfused. Capillary refill normal to all digits.   Neurologic Normal speech. Oriented to person, place, and time. Epicritic sensation to light touch grossly present bilaterally.  Dermatologic  proximal calf incision is well-healed. Central portion of inferior incision has a superficial wound dehiscence. No cellulitis. No purulence or malodor. There is some serous drainage. Remainder of incision has healed completely.  Orthopedic: Tenderness to palpation noted about the surgical site.   Radiographs: Right foot nonweightbearing films: Interval resection of posterior calcaneal enthesophyte and Haglund's deformity Assessment:   1. Surgical wound dehiscence, initial encounter    Plan:  Patient was evaluated and treated and all questions answered.  S/p foot surgery right -Debrided the central portion of fibrosis in the wound dehiscence. Granular wound base remains. Deeper layers remain intact. -Some drainage today. A wound culture of this was taken and sent to the lab. Will change to Bactrim to cover for MRSA. -WB Status: NWB in a tall CAM boot with plantarflexion wedges -Sutures:  Remaining sutures and staples were removed today. Steri-Strips were applied. -Foot was redressed with Iodosorb ointment and dry sterile gauze dressing. They should continue to change this every other day with mupirocin ointment   Return in about 2 weeks (around 02/16/2020) for wound re-check post op visit.

## 2020-02-11 LAB — WOUND CULTURE: Organism ID, Bacteria: NONE SEEN

## 2020-02-12 ENCOUNTER — Encounter: Payer: Self-pay | Admitting: Podiatry

## 2020-02-12 MED ORDER — LEVOFLOXACIN 750 MG PO TABS: 750.0000 mg | ORAL_TABLET | Freq: Every day | ORAL | 0 refills | Status: DC

## 2020-02-12 NOTE — Addendum Note (Signed)
Addended byLilian Kapur, Charae Depaolis R on: 02/12/2020 10:38 AM   Modules accepted: Orders

## 2020-02-14 NOTE — Telephone Encounter (Signed)
Spoke with the patient and he received the antibiotic and is taking it, notes improvement. Advised he can stop the bactrim for now.

## 2020-02-16 ENCOUNTER — Ambulatory Visit (INDEPENDENT_AMBULATORY_CARE_PROVIDER_SITE_OTHER): Payer: 59 | Admitting: Podiatry

## 2020-02-16 ENCOUNTER — Other Ambulatory Visit: Payer: Self-pay

## 2020-02-16 ENCOUNTER — Encounter: Payer: Self-pay | Admitting: Podiatry

## 2020-02-16 DIAGNOSIS — T8132XA Disruption of internal operation (surgical) wound, not elsewhere classified, initial encounter: Secondary | ICD-10-CM | POA: Diagnosis not present

## 2020-02-16 DIAGNOSIS — M216X1 Other acquired deformities of right foot: Secondary | ICD-10-CM

## 2020-02-16 DIAGNOSIS — Z9889 Other specified postprocedural states: Secondary | ICD-10-CM

## 2020-02-16 DIAGNOSIS — M6528 Calcific tendinitis, other site: Secondary | ICD-10-CM

## 2020-02-16 DIAGNOSIS — M9261 Juvenile osteochondrosis of tarsus, right ankle: Secondary | ICD-10-CM

## 2020-02-16 DIAGNOSIS — M21861 Other specified acquired deformities of right lower leg: Secondary | ICD-10-CM

## 2020-02-16 MED ORDER — LEVOFLOXACIN 750 MG PO TABS: 750.0000 mg | ORAL_TABLET | Freq: Every day | ORAL | 0 refills | Status: AC

## 2020-02-16 NOTE — Progress Notes (Signed)
  Subjective:  Patient ID: Wayne Hernandez, male    DOB: 1970-10-22,  MRN: [id number]  Chief Complaint  Patient presents with  . Wound Check    Right foot wound check. No pain reported from patient, healing well. Patient reported taking Antibiotic as prescribed.      DOS: 12/31/2019 Procedure: Debride Achilles tendon, resection heel spur posterior, Haglund's resection, gastrocnemius recession  50 y.o. male returns for post-op check. Still has little to no pain.  Thinks it is doing better, he feels better since he has been on the Levaquin  Review of Systems: Negative except as noted in the HPI. Denies N/V/F/Ch.   Objective:  There were no vitals filed for this visit. There is no height or weight on file to calculate BMI. Constitutional Well developed. Well nourished.  Vascular Foot warm and well perfused. Capillary refill normal to all digits.   Neurologic Normal speech. Oriented to person, place, and time. Epicritic sensation to light touch grossly present bilaterally.  Dermatologic  proximal calf incision is well-healed. Central portion of inferior incision has a now deep wound dehiscence with fibrotic material and skin necrosis.  Measurements proximally 2 cm x 1 cm x 0.5 cm.. No cellulitis. No purulence or malodor. There is some serous drainage. Remainder of incision has healed completely.  Orthopedic: Tenderness to palpation noted about the surgical site.   Radiographs: Right foot nonweightbearing films: Interval resection of posterior calcaneal enthesophyte and Haglund's deformity  Wound culture 12/22 with heavy growth of pansensitive Pseudomonas aeruginosa Assessment:   No diagnosis found. Plan:  Patient was evaluated and treated and all questions answered.  S/p foot surgery right -Debrided the central portion of the wound of all nonviable and devitalized tissue with a tissue nipper to a granular wound bed.  Depth of debridement: Subcutaneous tissue.  This was packed with  Promogran Prisma collagen dressing.  Measurements noted above. -Continue levofloxacin -WB Status: NWB in a tall CAM boot with plantarflexion wedges -Foot was redressed with silver collagen and dry sterile gauze dressing.  They will change every 3 days with packing of the wound.  Return in about 2 weeks (around 03/01/2020) for wound re-check.

## 2020-03-01 ENCOUNTER — Encounter: Payer: Self-pay | Admitting: Podiatry

## 2020-03-01 ENCOUNTER — Ambulatory Visit (INDEPENDENT_AMBULATORY_CARE_PROVIDER_SITE_OTHER): Payer: 59 | Admitting: Podiatry

## 2020-03-01 ENCOUNTER — Other Ambulatory Visit: Payer: Self-pay

## 2020-03-01 DIAGNOSIS — M21861 Other specified acquired deformities of right lower leg: Secondary | ICD-10-CM

## 2020-03-01 DIAGNOSIS — T8132XA Disruption of internal operation (surgical) wound, not elsewhere classified, initial encounter: Secondary | ICD-10-CM

## 2020-03-01 DIAGNOSIS — T81329A Deep disruption or dehiscence of operation wound, unspecified, initial encounter: Secondary | ICD-10-CM

## 2020-03-01 DIAGNOSIS — M6528 Calcific tendinitis, other site: Secondary | ICD-10-CM

## 2020-03-01 DIAGNOSIS — M216X1 Other acquired deformities of right foot: Secondary | ICD-10-CM

## 2020-03-01 DIAGNOSIS — M9261 Juvenile osteochondrosis of tarsus, right ankle: Secondary | ICD-10-CM

## 2020-03-01 DIAGNOSIS — Z9889 Other specified postprocedural states: Secondary | ICD-10-CM

## 2020-03-01 DIAGNOSIS — M62461 Contracture of muscle, right lower leg: Secondary | ICD-10-CM

## 2020-03-01 DIAGNOSIS — M7661 Achilles tendinitis, right leg: Secondary | ICD-10-CM

## 2020-03-01 MED ORDER — SULFAMETHOXAZOLE-TRIMETHOPRIM 800-160 MG PO TABS: 1.0000 | ORAL_TABLET | Freq: Two times a day (BID) | ORAL | 0 refills | Status: AC

## 2020-03-01 MED ORDER — LEVOFLOXACIN 750 MG PO TABS: 750.0000 mg | ORAL_TABLET | Freq: Every day | ORAL | 0 refills | Status: DC

## 2020-03-01 NOTE — Progress Notes (Signed)
  Subjective:  Patient ID: Wayne Hernandez, male    DOB: 1970/10/01,  MRN: [id number]  Chief Complaint  Patient presents with  . Routine Post Op     POV DOS 12/31/19 DEBRIDEMENT OF ACHILLES "its doing good"    DOS: 12/31/2019 Procedure: Debridement Achilles tendon, resection heel spur posterior, Haglund's resection, gastrocnemius recession  50 y.o. male returns for post-op check.  Doing well.  Still taking his Levaquin.  They have noted some drainage proximally.  Review of Systems: Negative except as noted in the HPI. Denies N/V/F/Ch.   Objective:  There were no vitals filed for this visit. There is no height or weight on file to calculate BMI. Constitutional Well developed. Well nourished.  Vascular Foot warm and well perfused. Capillary refill normal to all digits.   Neurologic Normal speech. Oriented to person, place, and time. Epicritic sensation to light touch grossly present bilaterally.  Dermatologic  proximal calf incision is well-healed.  Dehiscence smaller today and improved in size, now less than 1 cm in length and 4 mm in width.  There is still a small draining sinus approximately.  Orthopedic: Tenderness to palpation noted about the surgical site.   Radiographs: Right foot nonweightbearing films: Interval resection of posterior calcaneal enthesophyte and Haglund's deformity  Wound culture 12/22 with heavy growth of pansensitive Pseudomonas aeruginosa Assessment:   No diagnosis found. Plan:  Patient was evaluated and treated and all questions answered.  S/p foot surgery right -Minor superficial debridement performed today of all nonviable and devitalized tissue the tissue nipper to a granular wound bed.  Applied Iodosorb ointment and dry dressings.  Measurements noted above. -Continue levofloxacin, I would like to keep him on this for another 2 weeks, I also think considering the continuing draining sinus we should restart his Bactrim for another 2 weeks as  well -WB Status: Begin partial weightbearing in a tall CAM boot.  Removed half of the plantarflexion wedges at this point.  We will bring him back to neutral position at next visit -Change daily with mupirocin ointment.  Apply Puracol dressing every 3 days into distal wound.  Return in about 2 weeks (around 03/15/2020).

## 2020-03-15 ENCOUNTER — Other Ambulatory Visit: Payer: Self-pay

## 2020-03-15 ENCOUNTER — Ambulatory Visit (INDEPENDENT_AMBULATORY_CARE_PROVIDER_SITE_OTHER): Payer: 59 | Admitting: Podiatry

## 2020-03-15 ENCOUNTER — Encounter: Payer: Self-pay | Admitting: Podiatry

## 2020-03-15 DIAGNOSIS — M216X1 Other acquired deformities of right foot: Secondary | ICD-10-CM

## 2020-03-15 DIAGNOSIS — M21861 Other specified acquired deformities of right lower leg: Secondary | ICD-10-CM

## 2020-03-15 DIAGNOSIS — M6528 Calcific tendinitis, other site: Secondary | ICD-10-CM

## 2020-03-15 DIAGNOSIS — Z9889 Other specified postprocedural states: Secondary | ICD-10-CM

## 2020-03-15 DIAGNOSIS — M9261 Juvenile osteochondrosis of tarsus, right ankle: Secondary | ICD-10-CM

## 2020-03-15 NOTE — Patient Instructions (Signed)
Wear the boot when walking until Feb 28, then go back into sneakers  You may take the boot off to drive  You may return to work while wearing the boot (shoes after Feb 28)  Begin physical therapy  A physical therapy referral has been sent to: [facility] [address],  [place]  [address]  Main: [phone]  Call the number above to schedule your appointment

## 2020-03-15 NOTE — Progress Notes (Signed)
  Subjective:  Patient ID: Wayne Hernandez, male    DOB: 01-04-1971,  MRN: [id number]  Chief Complaint  Patient presents with  . Routine Post Op    "Its doing great"    DOS: 12/31/2019 Procedure: Debridement Achilles tendon, resection heel spur posterior, Haglund's resection, gastrocnemius recession  50 y.o. male returns for post-op check.  Doing well.  Nearly finished with the antibiotics.  They think it is healed.  Review of Systems: Negative except as noted in the HPI. Denies N/V/F/Ch.   Objective:  There were no vitals filed for this visit. There is no height or weight on file to calculate BMI. Constitutional Well developed. Well nourished.  Vascular Foot warm and well perfused. Capillary refill normal to all digits.   Neurologic Normal speech. Oriented to person, place, and time. Epicritic sensation to light touch grossly present bilaterally.  Dermatologic  incisions have completely healed now.  Orthopedic: Tenderness to palpation noted about the surgical site.   Radiographs: Right foot nonweightbearing films: Interval resection of posterior calcaneal enthesophyte and Haglund's deformity  Wound culture 12/22 with heavy growth of pansensitive Pseudomonas aeruginosa Assessment:   1. Acquired Haglund's deformity of right heel   2. Gastrocnemius equinus of right lower extremity   3. 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 S/P foot surgery, right    Plan:  Patient was evaluated and treated and all questions answered.  S/p foot surgery right -Dressings no longer needed.  I would continue to apply mupirocin ointment daily until all the scab is completely gone -Continue WBAT in the CAM boot, I removed the remaining heel lifts today.  Should transition this over the next 3 weeks.  After this he may begin to return to normal shoe gear -Begin physical therapy with an eccentric exercise program  Return in about 5 weeks (around 04/19/2020).

## 2020-03-21 ENCOUNTER — Other Ambulatory Visit: Payer: Self-pay

## 2020-03-21 ENCOUNTER — Ambulatory Visit: Payer: 59 | Attending: Podiatry

## 2020-03-21 DIAGNOSIS — M25571 Pain in right ankle and joints of right foot: Secondary | ICD-10-CM | POA: Diagnosis not present

## 2020-03-21 DIAGNOSIS — R29898 Other symptoms and signs involving the musculoskeletal system: Secondary | ICD-10-CM | POA: Diagnosis not present

## 2020-03-21 DIAGNOSIS — M25671 Stiffness of right ankle, not elsewhere classified: Secondary | ICD-10-CM | POA: Insufficient documentation

## 2020-03-21 NOTE — Therapy (Signed)
[place] [facility] [address], [place], [address] Phone: [phone]   Fax:  [phone]  Physical Therapy Evaluation  Patient Details  Name: Wayne Hernandez MRN: [id number] Date of Birth: January 06, 1971 No data recorded  Encounter Date: 03/21/2020   PT End of Session - 03/21/20 1058    Visit Number 1    Number of Visits 17    Date for PT Re-Evaluation 05/02/20    PT Start Time 0900    PT Stop Time 0940    PT Time Calculation (min) 40 min    Activity Tolerance Patient tolerated treatment well;No increased pain           Past Medical History:  Diagnosis Date  . Diabetes (HCC)   . Diverticulitis   . Elevated hemoglobin A1c   . Elevated uric acid in blood   . Esophagitis   . Hypertension   . Low HDL (under 40)   . Overweight     History reviewed. No pertinent surgical history.  There were no vitals filed for this visit.    Subjective Assessment - 03/21/20 0857    Subjective Patient is s/p surgery on Right ankle. He reports ankle is feeling swollen today because of working full day for first time yesterday. Pain- currently 0/10, 2/10 pain at worst in the last week mainly because of pad in CAM boot. Patient is able to walk and stand indefinitly without an increase in pain with use of boot. NWB out of boot. Patient had an infection about 3 weeks after surgery which set back recovery. Just finished antibioitic, no lingering effects from infection. No falls in last 6 mo, no unexplaned wt loss, no night sweats. Swollen after first full day of work yesterday. Wants to get back to work with no pain, and hobby is working on [employer] which he cannot do right now.    Pertinent History Surgery on Posterior R heel    Limitations House hold activities;Other (comment)   Anything without boot   How long can you stand comfortably? NWB without boot, indefinitly with boot    How long can you walk comfortably? 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 No/denies           ROM-  Plantarflexion- Right- 27 degrees, 40 degrees passive, Left- 40 degrees 40 degrees PROM  Dorsiflexion- R- 7 degrees from resting position, 0 degrees AROM from 90  (8 degrees PROM from 90)  degrees past neutral, L- 22 degrees from resting position, (8 degrees from 90)  Inversion- R- 22 degrees 33 Degrees PROM, L-30 AROM, 35degrees passive  Eversion- R- 12 degrees AROM,25 degrees PROM,  L-22 degrees AROM, 28 degrees passive  MMT Plantar- 3+/5, 5/5 Dorsi- 4-/5, 5/5 Inversion- 4/5, 5/5 Eversion- 4/5, 5/5      Objective measurements completed on examination: See above findings.               PT Education - 03/21/20 1057    Education Details HEP, Diagnosis/prognosis    Person(s) Educated Patient    Methods Explanation;Demonstration    Comprehension Verbalized understanding;Returned demonstration            PT Short Term Goals - 03/21/20 1041      PT SHORT TERM GOAL #1   Title Patient will show independence with HEP  Baseline 03/21/20- Currently dependent    Time 2    Period Weeks    Status New    Target Date 04/04/20             PT Long Term Goals - 03/21/20 1042      PT LONG TERM GOAL #1   Title Patient will be able to ambulate 576ft without the use of CAM boot without increase in pain    Baseline 03/21/20- currently NWB    Time 6    Period Weeks    Status New    Target Date 05/02/20      PT LONG TERM GOAL #2   Title Patient will have full AROM in Right ankle to show increased strength and ROM    Baseline 03/21/20- Plantarflexion: Right- 27 degrees, Left- 40 degrees Dorsiflexion: R- 7 degrees from resting position, 0 degrees AROM from 90 degrees, L- 22 degrees from resting position, 8 degrees from 90  Inversion- R- 22 degrees, L-30 AROM, Eversion- R- 12 degrees AROM, L-22 degrees AROM     Time 6    Period Weeks    Status New    Target Date 05/02/20      PT LONG TERM GOAL #3   Title Patient will increase FOTO score to 74 to show increase in functional ability in  Right LE    Baseline 03/21/20- 56    Time 6    Period Weeks    Status New    Target Date 05/02/20                  Plan - 03/21/20 1022    Clinical Impression Statement Patient is 50yo Male with Right ankle pain and hypomobility s/p surgery on the ankle. 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 The patient is currently using a CAM boot for ambulation and is NWB on the right lower extremity.  These limitations have stopped the patient from being able to work full time, and perform hobbies such as Glass blower/designer. Patient will benefit from skilled therapy to return to prior level of function.    Examination-Activity Limitations Bathing;Stairs;Stand;Squat;Transfers;Locomotion Level;Lift;Carry    Examination-Participation Restrictions Occupation;Yard Work;Cleaning;Community Activity    Stability/Clinical Decision Making Stable/Uncomplicated    Clinical Decision Making Low    Rehab Potential Good    PT Frequency 2x / week    PT Duration 6 weeks    PT Treatment/Interventions ADLs/Self Care Home Management;Cryotherapy;Electrical Stimulation;Moist Heat;Gait training;Functional mobility training;Therapeutic activities;Therapeutic exercise;Balance training;Neuromuscular re-education;Manual techniques;Scar mobilization;Passive range of motion;Dry needling;Joint Manipulations    PT Next Visit Plan Continued focous on strength to improve AROM    PT Home Exercise Plan 4-Way ankle with GTB           Patient will benefit from skilled therapeutic intervention in order to improve the following deficits and impairments:  Abnormal gait,Decreased activity tolerance,Decreased endurance,Decreased mobility,Decreased range of motion,Difficulty  walking,Hypomobility,Pain  Visit Diagnosis: Pain in right ankle and joints of right foot  Stiffness of right ankle, not elsewhere classified  Ankle weakness     Problem List Patient Active Problem List   Diagnosis Date Noted  . Polycythemia, secondary 11/26/2016    Silvano Rusk SPT 03/21/2020, 11:00 AM  [place] [facility] [address], [place], [address] Phone: [phone]   Fax:  [phone]  Name: Wayne Hernandez MRN: [id number] Date of Birth: [birth year]

## 2020-03-23 ENCOUNTER — Other Ambulatory Visit: Payer: Self-pay

## 2020-03-23 DIAGNOSIS — Z0000 Encounter for general adult medical examination without abnormal findings: Secondary | ICD-10-CM

## 2020-03-24 LAB — CMP12+LP+TP+TSH+6AC+PSA+CBC…
ALT: 21 IU/L (ref 0–44)
AST: 26 IU/L (ref 0–40)
Albumin/Globulin Ratio: 1.4 (ref 1.2–2.2)
Albumin: 4.1 g/dL (ref 4.0–5.0)
Alkaline Phosphatase: 113 IU/L (ref 44–121)
BUN/Creatinine Ratio: 12 (ref 9–20)
BUN: 11 mg/dL (ref 6–24)
Basophils Absolute: 0.1 10*3/uL (ref 0.0–0.2)
Basos: 1 %
Bilirubin Total: 0.3 mg/dL (ref 0.0–1.2)
Calcium: 8.8 mg/dL (ref 8.7–10.2)
Chloride: 98 mmol/L (ref 96–106)
Chol/HDL Ratio: 5 ratio (ref 0.0–5.0)
Cholesterol, Total: 200 mg/dL — ABNORMAL HIGH (ref 100–199)
Creatinine, Ser: 0.92 mg/dL (ref 0.76–1.27)
EOS (ABSOLUTE): 0.3 10*3/uL (ref 0.0–0.4)
Eos: 3 %
Estimated CHD Risk: 1 times avg. (ref 0.0–1.0)
Free Thyroxine Index: 2.4 (ref 1.2–4.9)
GFR calc Af Amer: 112 mL/min/{1.73_m2} (ref >59)
GFR calc non Af Amer: 97 mL/min/{1.73_m2} (ref >59)
GGT: 32 IU/L (ref 0–65)
Globulin, Total: 3 g/dL (ref 1.5–4.5)
Glucose: 121 mg/dL — ABNORMAL HIGH (ref 65–99)
HDL: 40 mg/dL (ref >39)
Hematocrit: 51.4 % — ABNORMAL HIGH (ref 37.5–51.0)
Hemoglobin: 17.1 g/dL (ref 13.0–17.7)
Immature Grans (Abs): 0.1 10*3/uL (ref 0.0–0.1)
Immature Granulocytes: 1 %
Iron: 103 ug/dL (ref 38–169)
LDH: 151 IU/L (ref 121–224)
LDL Chol Calc (NIH): 138 mg/dL — ABNORMAL HIGH (ref 0–99)
Lymphocytes Absolute: 1.3 10*3/uL (ref 0.7–3.1)
Lymphs: 15 %
MCH: 27.7 pg (ref 26.6–33.0)
MCHC: 33.3 g/dL (ref 31.5–35.7)
MCV: 83 fL (ref 79–97)
Monocytes Absolute: 0.6 10*3/uL (ref 0.1–0.9)
Monocytes: 7 %
Neutrophils Absolute: 6.3 10*3/uL (ref 1.4–7.0)
Neutrophils: 73 %
Phosphorus: 2.8 mg/dL (ref 2.8–4.1)
Platelets: 292 10*3/uL (ref 150–450)
Potassium: 4.5 mmol/L (ref 3.5–5.2)
Prostate Specific Ag, Serum: 0.3 ng/mL (ref 0.0–4.0)
RBC: 6.18 x10E6/uL — ABNORMAL HIGH (ref 4.14–5.80)
RDW: 15.4 % (ref 11.6–15.4)
Sodium: 135 mmol/L (ref 134–144)
T3 Uptake Ratio: 26 % (ref 24–39)
T4, Total: 9.1 ug/dL (ref 4.5–12.0)
TSH: 0.816 u[IU]/mL (ref 0.450–4.500)
Total Protein: 7.1 g/dL (ref 6.0–8.5)
Triglycerides: 124 mg/dL (ref 0–149)
Uric Acid: 8 mg/dL (ref 3.8–8.4)
VLDL Cholesterol Cal: 22 mg/dL (ref 5–40)
WBC: 8.6 10*3/uL (ref 3.4–10.8)

## 2020-03-27 ENCOUNTER — Other Ambulatory Visit: Payer: Self-pay

## 2020-03-27 ENCOUNTER — Ambulatory Visit: Payer: 59

## 2020-03-27 DIAGNOSIS — M25571 Pain in right ankle and joints of right foot: Secondary | ICD-10-CM

## 2020-03-27 DIAGNOSIS — M25671 Stiffness of right ankle, not elsewhere classified: Secondary | ICD-10-CM

## 2020-03-27 DIAGNOSIS — R29898 Other symptoms and signs involving the musculoskeletal system: Secondary | ICD-10-CM

## 2020-03-27 LAB — HGB A1C W/O EAG: Hgb A1c MFr Bld: 5.7 % — ABNORMAL HIGH (ref 4.8–5.6)

## 2020-03-27 NOTE — Therapy (Signed)
[place] [facility] [address] [place], [address] Phone: [phone]   Fax:  [phone]  Physical Therapy Treatment  Patient Details  Name: Wayne Hernandez MRN: [id number] Date of Birth: [birth year] No data recorded  Encounter Date: 03/27/2020   PT End of Session - 03/27/20 1122    Visit Number 2    Number of Visits 17    Date for PT Re-Evaluation 05/02/20    PT Start Time 0945    PT Stop Time 1030    PT Time Calculation (min) 45 min    Activity Tolerance Patient tolerated treatment well;No increased pain           Past Medical History:  Diagnosis Date  . Diabetes (HCC)   . Diverticulitis   . Elevated hemoglobin A1c   . Elevated uric acid in blood   . Esophagitis   . Hypertension   . Low HDL (under 40)   . Overweight     History reviewed. No pertinent surgical history.  There were no vitals filed for this visit.   Subjective Assessment - 03/27/20 0945    Subjective Patient reports exercises have been going well at home. No increase in pain with HEP. He states that plantarflexion with the band feels the most difficult so far.    Pertinent History Surgery on Posterior R heel    Limitations House hold activities;Other (comment)   Anything without boot   How long can you stand comfortably? NWB without boot, indefinitly with boot    How long can you walk comfortably? 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 No/denies             Therapeutic Exercise BAPS board- Level 3- Plantarflexion/dorsiflexion, 2 min Inverson/eversion Isometric 4 way ankle- 1x20  4 way ankle- 1x20 BTB Towel curls- 3x15 Weight shifting- 2x12- Side to side (small lift of left foot), Forwards and backwards (3/10 pain)   Therex done to improve strength and ROM   Manual Therapy  STM to reduce swelling in  ankle          PT Education - 03/27/20 1028    Education Details HEP education/ Form/technique    Person(s) Educated Patient    Methods Explanation;Demonstration    Comprehension Verbalized understanding;Returned demonstration            PT Short Term Goals - 03/21/20 1041      PT SHORT TERM GOAL #1   Title Patient will show independence with HEP    Baseline 03/21/20- Currently dependent    Time 2    Period Weeks    Status New    Target Date 04/04/20             PT Long Term Goals - 03/21/20 1042      PT LONG TERM GOAL #1   Title Patient will be able to ambulate 543ft without the use of CAM boot without increase in pain    Baseline 03/21/20- currently NWB    Time 6    Period Weeks    Status New    Target Date 05/02/20      PT LONG TERM GOAL #2   Title Patient will have full AROM in Right ankle to show increased strength and ROM    Baseline 03/21/20- Plantarflexion: Right- 27 degrees, Left- 40 degrees Dorsiflexion:  R- 7 degrees from resting position, 0 degrees AROM from 90 degrees, L- 22 degrees from resting position, 8 degrees from 90  Inversion- R- 22 degrees, L-30 AROM, Eversion- R- 12 degrees AROM, L-22 degrees AROM    Time 6    Period Weeks    Status New    Target Date 05/02/20      PT LONG TERM GOAL #3   Title Patient will increase FOTO score to 74 to show increase in functional ability in  Right LE    Baseline 03/21/20- 56    Time 6    Period Weeks    Status New    Target Date 05/02/20                 Plan - 03/27/20 1029    Clinical Impression Statement Focused on strength and ROM in RLE today. Patient performed exercises at home without an increase in pain. Patient had improved AROM this session when compared to the last session, however it is still limited. Patient tolerated exercises today without an increase in pain. Patient had difficulty with weight shifting onto Right lower extremity. Patient will benefit from further skilled therapy to  return to prior level of function.    Examination-Activity Limitations Bathing;Stairs;Stand;Squat;Transfers;Locomotion Level;Lift;Carry    Examination-Participation Restrictions Occupation;Yard Work;Cleaning;Community Activity    Stability/Clinical Decision Making Stable/Uncomplicated    Rehab Potential Good    PT Frequency 2x / week    PT Duration 6 weeks    PT Treatment/Interventions ADLs/Self Care Home Management;Cryotherapy;Electrical Stimulation;Moist Heat;Gait training;Functional mobility training;Therapeutic activities;Therapeutic exercise;Balance training;Neuromuscular re-education;Manual techniques;Scar mobilization;Passive range of motion;Dry needling;Joint Manipulations    PT Next Visit Plan Continued focous on strength to improve AROM    PT Home Exercise Plan 4-Way ankle with GTB           Patient will benefit from skilled therapeutic intervention in order to improve the following deficits and impairments:  Abnormal gait,Decreased activity tolerance,Decreased endurance,Decreased mobility,Decreased range of motion,Difficulty walking,Hypomobility,Pain  Visit Diagnosis: Pain in right ankle and joints of right foot  Stiffness of right ankle, not elsewhere classified  Ankle weakness     Problem List Patient Active Problem List   Diagnosis Date Noted  . Polycythemia, secondary 11/26/2016    Silvano Rusk SPT 03/27/2020, 11:23 AM  [place] [facility] [address] [place], [address] Phone: [phone]   Fax:  [phone]  Name: Wayne Hernandez MRN: [id number] Date of Birth: 1970-06-04

## 2020-03-29 ENCOUNTER — Other Ambulatory Visit: Payer: Self-pay

## 2020-03-29 ENCOUNTER — Ambulatory Visit: Payer: 59

## 2020-03-29 DIAGNOSIS — R29898 Other symptoms and signs involving the musculoskeletal system: Secondary | ICD-10-CM

## 2020-03-29 DIAGNOSIS — M25571 Pain in right ankle and joints of right foot: Secondary | ICD-10-CM | POA: Diagnosis not present

## 2020-03-29 DIAGNOSIS — M25671 Stiffness of right ankle, not elsewhere classified: Secondary | ICD-10-CM | POA: Diagnosis not present

## 2020-03-29 NOTE — Therapy (Signed)
[place] [facility] [address], [place], [address] Phone: [phone]   Fax:  [phone]  Physical Therapy Treatment  Patient Details  Name: Wayne Hernandez MRN: [id number] Date of Birth: [birth year] No data recorded  Encounter Date: 03/29/2020   PT End of Session - 03/29/20 1048    Visit Number 3    Number of Visits 17    Date for PT Re-Evaluation 05/02/20    PT Start Time 1030    PT Stop Time 1115    PT Time Calculation (min) 45 min    Activity Tolerance Patient tolerated treatment well;No increased pain           Past Medical History:  Diagnosis Date  . Diabetes (HCC)   . Diverticulitis   . Elevated hemoglobin A1c   . Elevated uric acid in blood   . Esophagitis   . Hypertension   . Low HDL (under 40)   . Overweight     No past surgical history on file.  There were no vitals filed for this visit.   Subjective Assessment - 03/29/20 1032    Subjective Pt states he is excited to get rid of the boot in a few weeks.  He is not having any pain in his ankle today.    Pertinent History Surgery on Posterior R heel    Limitations House hold activities;Other (comment)   Anything without boot   How long can you stand comfortably? NWB without boot, indefinitly with boot    How long can you walk comfortably? 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, Glass blower/designer as a hobby            Treatment Today:  Manual Therapy: (5 minutes) STM for swelling ant/lateral foot and ankle Manual ankle PF stretch, toe PF stretch 3x20 sec  Therapeutic Exercise: (40 min) BAPS board- Level 3- Plantarflexion/dorsiflexion, 2 min Inverson/eversion, 10x CCW, 10x CW Isometric 4 way ankle- 1x20 with PT (MRE): all 4 directions 4 way ankle- 1x20 BTB- reviewed for HEP Towel curls- 3x15 Seated toe extension+ DF 3x15   Weight bearing progression: with 1-2  fingers on high mat table for stability Weight shifting  side to side (0/10 pain today): 2x10 Marches (emphasizing weight shift): 2x10 Hamstring curl (emphasizing weight shift): 2x10  Tandem stance R foot placed in front of L: 3x45 sec           PT Education - 03/29/20 1048    Education Details exercise form, technique    Person(s) Educated Patient    Methods Explanation    Comprehension Verbalized understanding            PT Short Term Goals - 03/29/20 1133      PT SHORT TERM GOAL #1   Title Patient will show independence with HEP    Baseline 03/21/20- Currently dependent    Time 2    Period Weeks    Status New    Target Date 04/04/20             PT Long Term Goals - 03/29/20 1133      PT LONG TERM GOAL #1   Title Patient will be able to ambulate 523ft without the use of CAM boot without increase in pain    Baseline 03/21/20- currently NWB    Time 6    Period Weeks    Status New  PT LONG TERM GOAL #2   Title Patient will have full AROM in Right ankle to show increased strength and ROM    Baseline 03/21/20- Plantarflexion: Right- 27 degrees, Left- 40 degrees Dorsiflexion: R- 7 degrees from resting position, 0 degrees AROM from 90 degrees, L- 22 degrees from resting position, 8 degrees from 90  Inversion- R- 22 degrees, L-30 AROM, Eversion- R- 12 degrees AROM, L-22 degrees AROM    Time 6    Period Weeks    Status New      PT LONG TERM GOAL #3   Title Patient will increase FOTO score to 74 to show increase in functional ability in  Right LE    Baseline 03/21/20- 56    Time 6    Period Weeks    Status New                 Plan - 03/29/20 1128    Clinical Impression Statement Pt tolerated progression of WB activities well today with report of 0/10 pain during and after session.  Also introduced tandem stance for proprioception retraining and he demonstrated good stability with this today.  Pt's plantarflexion AROM remains limited, suspect mild swelling  and gastrocsoleus weakness is still contributing to this.  He should continue to benefit from gradually progressing strengthening with isometrics and isotonic strengthening to prepare for transition out of the CAM walking boot as expected at end of this month.    Examination-Activity Limitations Bathing;Stairs;Stand;Squat;Transfers;Locomotion Level;Lift;Carry    Examination-Participation Restrictions Occupation;Yard Work;Cleaning;Community Activity    Stability/Clinical Decision Making Stable/Uncomplicated    Rehab Potential Good    PT Frequency 2x / week    PT Duration 6 weeks    PT Treatment/Interventions ADLs/Self Care Home Management;Cryotherapy;Electrical Stimulation;Moist Heat;Gait training;Functional mobility training;Therapeutic activities;Therapeutic exercise;Balance training;Neuromuscular re-education;Manual techniques;Scar mobilization;Passive range of motion;Dry needling;Joint Manipulations    PT Next Visit Plan Continued focous on strength to improve AROM    PT Home Exercise Plan 4-Way ankle with GTB           Patient will benefit from skilled therapeutic intervention in order to improve the following deficits and impairments:  Abnormal gait,Decreased activity tolerance,Decreased endurance,Decreased mobility,Decreased range of motion,Difficulty walking,Hypomobility,Pain  Visit Diagnosis: Pain in right ankle and joints of right foot  Stiffness of right ankle, not elsewhere classified  Ankle weakness     Problem List Patient Active Problem List   Diagnosis Date Noted  . Polycythemia, secondary 11/26/2016    Ardine Bjork 03/29/2020, 11:37 AM Max Fickle, PT, DPT   [place] [facility] [address], [place], [address] Phone: [phone]   Fax:  [phone]  Name: Wayne Hernandez MRN: [id number] Date of Birth: [birth year]

## 2020-03-30 ENCOUNTER — Ambulatory Visit: Payer: Self-pay | Admitting: Adult Medicine

## 2020-03-30 VITALS — BP 119/73 | HR 84 | Temp 98.1°F | Ht 70.0 in | Wt 262.6 lb

## 2020-03-30 DIAGNOSIS — Z0000 Encounter for general adult medical examination without abnormal findings: Secondary | ICD-10-CM

## 2020-03-30 NOTE — Progress Notes (Unsigned)
Subjective:    Patient ID: Wayne Hernandez, male    DOB: 1970-12-19, 50 y.o.   MRN: [id number]  HPI  51yr 11/21 underwent rt heel spur surgery. States He has been unable to exercise and put on a few pounds. He was placed on Metformin a yr ago with hi bmi .  He has hx of htn controlled on ace inhibitor Zesteril  Chart review aspirin EC 81 MG tablet gabapentin (NEURONTIN) 300 MG capsule(Expired) ibuprofen (ADVIL,MOTRIN) 200 MG tablet lisinopril (ZESTRIL) 10 MG tablet metFORMIN (GLUCOPHAGE) 500 MG tablet bd mupirocin    Review of Systems  Constitutional: Positive for activity change.  HENT: Negative.   Eyes: Negative.   Respiratory: Negative.   Gastrointestinal: Positive for abdominal distention. Negative for anal bleeding and rectal pain.  Endocrine: Negative.   Genitourinary: Negative.   Musculoskeletal: Negative.   Skin: Negative.   Neurological: Negative.   Psychiatric/Behavioral: Negative.        Objective:   Physical Exam Constitutional:      Appearance: Normal appearance. He is obese.  HENT:     Head: Normocephalic and atraumatic.     Right Ear: Tympanic membrane normal.     Left Ear: Tympanic membrane normal.     Nose: Nose normal.     Mouth/Throat:     Mouth: Mucous membranes are moist.     Pharynx: Oropharynx is clear.  Eyes:     Extraocular Movements: Extraocular movements intact.     Conjunctiva/sclera: Conjunctivae normal.     Pupils: Pupils are equal, round, and reactive to light.  Cardiovascular:     Rate and Rhythm: Normal rate and regular rhythm.  Pulmonary:     Effort: Pulmonary effort is normal.     Breath sounds: Normal breath sounds.  Abdominal:     General: Bowel sounds are normal. There is distension.     Palpations: Abdomen is soft.  Genitourinary:    Testes: Normal.     Prostate: Normal.     Rectum: Normal.     Comments:       Prostrate  Sensitive rt pole no nodules, nl size,   Musculoskeletal:        General: Signs of injury  present.     Cervical back: Neck supple.       Legs:     Comments:  Musculoskeletal:       Moderate sacropenia upper extremity     Neurological:     Mental Status: He is alert.         Ref Range & Units 7 d ago 3 mo ago  Hgb A1c MFr Bld 4.8 - 5.6 % 5.7High  7.5High CM     Ref Range & Units 7 d ago  (03/23/20) 3 mo ago  (12/17/19) 9 mo ago  (06/08/19) 2 yr ago  (06/17/17) 3 yr ago  (12/17/16) 3 yr ago  (11/29/16) 3 yr ago  (11/29/16)  Glucose 65 - 99 mg/dL 195KDTO   671IWPY       Uric Acid 3.8 - 8.4 mg/dL 8.0   8.2 CM       Comment:      Therapeutic target for gout patients: <6.0  BUN 6 - 24 mg/dL 11   9       Creatinine, Ser 0.76 - 1.27 mg/dL 0.99   8.33       GFR calc non Af Amer >59 mL/min/1.73 97   86       GFR calc Af Amer >59 mL/min/1.73  112   99 CM        BUN/Creatinine Ratio 9 - 20 12   9        Sodium 134 - 144 mmol/L 135   137       Potassium 3.5 - 5.2 mmol/L 4.5   4.7       Chloride 96 - 106 mmol/L 98   99       Calcium 8.7 - 10.2 mg/dL 8.8   9.1       Phosphorus 2.8 - 4.1 mg/dL 2.8         Total Protein 6.0 - 8.5 g/dL 7.1   7.3       Albumin 4.0 - 5.0 g/dL 4.1   4.0       Globulin, Total 1.5 - 4.5 g/dL 3.0   3.3       Albumin/Globulin Ratio 1.2 - 2.2 1.4   1.2       Bilirubin Total 0.0 - 1.2 mg/dL 0.3   0.5       Alkaline Phosphatase 44 - 121 IU/L 113   119High R       LDH 121 - 224 IU/L 151   161       AST 0 - 40 IU/L 26   20       ALT 0 - 44 IU/L 21   18       GGT 0 - 65 IU/L 32   26       Iron 38 - 169 ug/dL 4.0JWJ   86    89 R    Cholesterol, Total 100 - 199 mg/dL 191  478GNFA  213YQMV       Triglycerides 0 - 149 mg/dL 784ONGE  952  841       HDL >39 mg/dL 40  324  40NUU       VLDL Cholesterol Cal 5 - 40 mg/dL 22  25  21        LDL Chol Calc (NIH) 0 - 99 mg/dL 72ZDG   644IHKV       Chol/HDL Ratio 0.0 - 5.0 ratio 5.0  5.6High CM  5.3High        TSH 0.450 - 4.500 uIU/mL 0.816   0.715       T4, Total  4.5 - 12.0 ug/dL 9.1   8.8       T3 Uptake Ratio 24 - 39 % 26   23Low       Free Thyroxine Index 1.2 - 4.9 2.4   2.0       Prostate Specific Ag, Serum 0.0 - 4.0 ng/mL 0.3   0.4 CM        RBC 4.14 - 5.80 x10E6/uL 6.18High   6.04High  6.10High R  6.08High R   6.04High R   Hemoglobin 13.0 - 17.7 g/dL 425ZDGL   875IEPP  29.5 R  18.8 R   17.1 R   Hematocrit 37.5 - 51.0 % 51.4High   50.8  50.9 R  51.0 R   50.0 R   MCV 79 - 97 fL 83   84  83.4 R  83.9 R   82.9 R   MCH 26.6 - 33.0 pg 27.7   28.6  29.1 R  28.5 R   28.4 R   MCHC 31.5 - 35.7 g/dL 41.6   60.6  30.1 R  60.1 R   34.2 R   RDW 11.6 - 15.4 % 15.4?   13.5  Ekg interpretation: low voltage,  Lt axis, Lae no acute finding Assessment & Plan:  Hyperglycemia with borderline elevation of HbA1c this may be an opportune time for Gtt/insulin test. Will revisit in 25m after surgical rehab completed  Hb/Hct elevation has been stable will continue to monitor. Denies hx of altitude acclimatization. if value       changes Erythropoietin level with or without renal utz should be ordered  S/p Rt heel spur exicision, achilles tendon repair. Boot removal March

## 2020-04-03 ENCOUNTER — Other Ambulatory Visit: Payer: Self-pay

## 2020-04-03 ENCOUNTER — Ambulatory Visit: Payer: 59

## 2020-04-03 DIAGNOSIS — M25671 Stiffness of right ankle, not elsewhere classified: Secondary | ICD-10-CM | POA: Diagnosis not present

## 2020-04-03 DIAGNOSIS — R29898 Other symptoms and signs involving the musculoskeletal system: Secondary | ICD-10-CM | POA: Diagnosis not present

## 2020-04-03 DIAGNOSIS — M25571 Pain in right ankle and joints of right foot: Secondary | ICD-10-CM | POA: Diagnosis not present

## 2020-04-03 NOTE — Therapy (Unsigned)
[place] [facility] [address], [place], [address] Phone: [phone]   Fax:  [phone]  Physical Therapy Treatment  Patient Details  Name: Wayne Hernandez MRN: [id number] Date of Birth: Dec 22, 1970 No data recorded  Encounter Date: 04/03/2020   PT End of Session - 04/03/20 1607    Visit Number 4    Number of Visits 17    Date for PT Re-Evaluation 05/02/20    PT Start Time 1515    PT Stop Time 1600    PT Time Calculation (min) 45 min    Activity Tolerance Patient tolerated treatment well;No increased pain           Past Medical History:  Diagnosis Date  . Diabetes (HCC)   . Diverticulitis   . Elevated hemoglobin A1c   . Elevated uric acid in blood   . Esophagitis   . Hypertension   . Low HDL (under 40)   . Overweight     History reviewed. No pertinent surgical history.  There were no vitals filed for this visit.   Subjective Assessment - 04/03/20 1519    Subjective Pt states he has been working on wt shifting at home and has not been having any pain. Has been working on moving around without the boot more. No pain today    Pertinent History Surgery on Posterior R heel    Limitations House hold activities;Other (comment)   Anything without boot   How long can you stand comfortably? NWB without boot, indefinitly with boot    How long can you walk comfortably? 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 No/denies             Manual Therapy: (5 minutes) STM for swelling ant/lateral foot and ankle STM to gastroc for trigger point release Manual ankle PF stretch, toe PF stretch 3x20 sec   Therapeutic Exercise: (40 min) BAPS board- Level 3- Plantarflexion/dorsiflexion, 2 min Inverson/eversion, 10x CCW, 10x CW Isometric 4 way ankle- 1x20 with PT (MRE): all 4 directions Marches  (emphasizing weight shift): 2x10 Hamstring curl (emphasizing weight shift): 2x10 Tandem stance R foot placed in front of L: 3x45 sec Tandem balance with pregait- 2x10  Total Gym 2x12 calf raises (13)    Therapeutic exercise to increase ROM and increase strength           PT Education - 04/03/20 1603    Education Details Form/technique with exercise    Person(s) Educated Patient    Methods Explanation;Demonstration    Comprehension Verbalized understanding;Returned demonstration            PT Short Term Goals - 03/29/20 1133      PT SHORT TERM GOAL #1   Title Patient will show independence with HEP    Baseline 03/21/20- Currently dependent    Time 2    Period Weeks    Status New    Target Date 04/04/20             PT Long Term Goals - 03/29/20 1133      PT LONG TERM GOAL #1   Title Patient will be able to ambulate 520ft without the use of CAM boot without increase in pain    Baseline 03/21/20- currently NWB    Time 6    Period Weeks    Status New  PT LONG TERM GOAL #2   Title Patient will have full AROM in Right ankle to show increased strength and ROM    Baseline 03/21/20- Plantarflexion: Right- 27 degrees, Left- 40 degrees Dorsiflexion: R- 7 degrees from resting position, 0 degrees AROM from 90 degrees, L- 22 degrees from resting position, 8 degrees from 90  Inversion- R- 22 degrees, L-30 AROM, Eversion- R- 12 degrees AROM, L-22 degrees AROM    Time 6    Period Weeks    Status New      PT LONG TERM GOAL #3   Title Patient will increase FOTO score to 74 to show increase in functional ability in  Right LE    Baseline 03/21/20- 56    Time 6    Period Weeks    Status New                 Plan - 04/03/20 1603    Clinical Impression Statement Patient tolerated further progression of WB activites with report of 0/10 pain. Progressed to begin light strengthening exercises. Patient tolerated these exercises without any increase in pain. Paitent  plantarflexion AROM remains limited due to swelling and gastroc weakness. Patient will benefit from futher skilled therapy to return to prior level of function.    Examination-Activity Limitations Bathing;Stairs;Stand;Squat;Transfers;Locomotion Level;Lift;Carry    Examination-Participation Restrictions Occupation;Yard Work;Cleaning;Community Activity    Stability/Clinical Decision Making Stable/Uncomplicated    Rehab Potential Good    PT Frequency 2x / week    PT Duration 6 weeks    PT Treatment/Interventions ADLs/Self Care Home Management;Cryotherapy;Electrical Stimulation;Moist Heat;Gait training;Functional mobility training;Therapeutic activities;Therapeutic exercise;Balance training;Neuromuscular re-education;Manual techniques;Scar mobilization;Passive range of motion;Dry needling;Joint Manipulations    PT Next Visit Plan Continued focous on strength to improve AROM    PT Home Exercise Plan 4-Way ankle with GTB, Towel Squeezes, Wt shifts           Patient will benefit from skilled therapeutic intervention in order to improve the following deficits and impairments:  Abnormal gait,Decreased activity tolerance,Decreased endurance,Decreased mobility,Decreased range of motion,Difficulty walking,Hypomobility,Pain  Visit Diagnosis: Pain in right ankle and joints of right foot  Stiffness of right ankle, not elsewhere classified  Ankle weakness     Problem List Patient Active Problem List   Diagnosis Date Noted  . Polycythemia, secondary 11/26/2016    Silvano Rusk 04/03/2020, 4:08 PM  [place] [facility] PHYSICAL AND SPORTS MEDICINE [address] [place], [address] Phone: [phone]   Fax:  [phone]  Name: Wayne Hernandez MRN: [id number] Date of Birth: Jun 17, 1970

## 2020-04-05 ENCOUNTER — Ambulatory Visit: Payer: 59

## 2020-04-05 ENCOUNTER — Other Ambulatory Visit: Payer: Self-pay

## 2020-04-05 DIAGNOSIS — M25671 Stiffness of right ankle, not elsewhere classified: Secondary | ICD-10-CM

## 2020-04-05 DIAGNOSIS — R29898 Other symptoms and signs involving the musculoskeletal system: Secondary | ICD-10-CM

## 2020-04-05 DIAGNOSIS — M25571 Pain in right ankle and joints of right foot: Secondary | ICD-10-CM

## 2020-04-05 NOTE — Therapy (Unsigned)
[facility] [address], [place], [address] Phone: [phone]   Fax:  [phone]  Physical Therapy Treatment  Patient Details  Name: Wayne Hernandez MRN: [id number] Date of Birth: [birth year] No data recorded  Encounter Date: 04/05/2020   PT End of Session - 04/05/20 1101    Visit Number 5    Number of Visits 17    Date for PT Re-Evaluation 05/02/20    PT Start Time 0945    PT Stop Time 1030    PT Time Calculation (min) 45 min    Activity Tolerance Patient tolerated treatment well;No increased pain           Past Medical History:  Diagnosis Date  . Diabetes (HCC)   . Diverticulitis   . Elevated hemoglobin A1c   . Elevated uric acid in blood   . Esophagitis   . Hypertension   . Low HDL (under 40)   . Overweight     History reviewed. No pertinent surgical history.  There were no vitals filed for this visit.   Subjective Assessment - 04/05/20 0949    Subjective Pt reports swelling in ankle has been down significantly. Patient has been using boot less when at home and ankle is feeling great.    Pertinent History Surgery on Posterior R heel    Limitations House hold activities;Other (comment)   Anything without boot   How long can you stand comfortably? NWB without boot, indefinitly with boot    How long can you walk comfortably? 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 No/denies             Manual Therapy: (10 minutes) STM for swelling ant/lateral foot and ankle STM to gastroc for trigger point release Manual ankle PF stretch, toe PF stretch 3x20 sec   Manual therapy to reduce swelling and improve ROM     Therapeutic Exercise: (35 min) BAPS board- Level 4- Plantarflexion/dorsiflexion, 2 min Inverson/eversion, 10x CCW, 10x CW Marches (emphasizing weight shift):  2x10 Single Leg Stance: 3x45 sec Tandem balance with pregait - 2x10  Total Gym 2x12 calf raises (17) Therapeutic exercise to increase ROM and increase strength             PT Education - 04/05/20 0950    Education Details form/technique with exercise    Person(s) Educated Patient    Methods Explanation;Demonstration    Comprehension Verbalized understanding;Returned demonstration            PT Short Term Goals - 03/29/20 1133      PT SHORT TERM GOAL #1   Title Patient will show independence with HEP    Baseline 03/21/20- Currently dependent    Time 2    Period Weeks    Status New    Target Date 04/04/20             PT Long Term Goals - 03/29/20 1133      PT LONG TERM GOAL #1   Title Patient will be able to ambulate 547ft without the use of CAM boot without increase in pain    Baseline 03/21/20- currently NWB    Time 6    Period Weeks    Status New      PT LONG TERM GOAL #2   Title Patient will have full AROM in Right  ankle to show increased strength and ROM    Baseline 03/21/20- Plantarflexion: Right- 27 degrees, Left- 40 degrees Dorsiflexion: R- 7 degrees from resting position, 0 degrees AROM from 90 degrees, L- 22 degrees from resting position, 8 degrees from 90  Inversion- R- 22 degrees, L-30 AROM, Eversion- R- 12 degrees AROM, L-22 degrees AROM    Time 6    Period Weeks    Status New      PT LONG TERM GOAL #3   Title Patient will increase FOTO score to 74 to show increase in functional ability in  Right LE    Baseline 03/21/20- 56    Time 6    Period Weeks    Status New                 Plan - 04/05/20 1059    Clinical Impression Statement Further focus on wt bearing and LE strength. Pt reports no increased pain with these progressions. Patient is limited in plantarflexion ROM due to swelling and strength. Patient will benefit from further skilled therapy to return to PLOF.    Examination-Activity Limitations  Bathing;Stairs;Stand;Squat;Transfers;Locomotion Level;Lift;Carry    Examination-Participation Restrictions Occupation;Yard Work;Cleaning;Community Activity    Stability/Clinical Decision Making Stable/Uncomplicated    Rehab Potential Good    PT Frequency 2x / week    PT Duration 6 weeks    PT Treatment/Interventions ADLs/Self Care Home Management;Cryotherapy;Electrical Stimulation;Moist Heat;Gait training;Functional mobility training;Therapeutic activities;Therapeutic exercise;Balance training;Neuromuscular re-education;Manual techniques;Scar mobilization;Passive range of motion;Dry needling;Joint Manipulations    PT Next Visit Plan Continued focous on strength to improve AROM    PT Home Exercise Plan 4-Way ankle with GTB, Towel Squeezes, Wt shifts           Patient will benefit from skilled therapeutic intervention in order to improve the following deficits and impairments:  Abnormal gait,Decreased activity tolerance,Decreased endurance,Decreased mobility,Decreased range of motion,Difficulty walking,Hypomobility,Pain  Visit Diagnosis: Pain in right ankle and joints of right foot  Stiffness of right ankle, not elsewhere classified  Ankle weakness     Problem List Patient Active Problem List   Diagnosis Date Noted  . Polycythemia, secondary 11/26/2016    Wayne Hernandez 04/05/2020, 11:02 AM  Wayne Hernandez SPT   [place] [facility] PH[facility] SPORTS MEDIC[facility] [address], [place], [address] Phone: [phone]   Fax:  [phone]  Name: Wayne Hernandez MRN: [id number] Date of Birth: Jan 30, 1971

## 2020-04-10 ENCOUNTER — Ambulatory Visit: Payer: 59

## 2020-04-10 ENCOUNTER — Other Ambulatory Visit: Payer: Self-pay

## 2020-04-10 ENCOUNTER — Telehealth: Payer: Self-pay

## 2020-04-10 DIAGNOSIS — R29898 Other symptoms and signs involving the musculoskeletal system: Secondary | ICD-10-CM | POA: Diagnosis not present

## 2020-04-10 DIAGNOSIS — M25671 Stiffness of right ankle, not elsewhere classified: Secondary | ICD-10-CM | POA: Diagnosis not present

## 2020-04-10 DIAGNOSIS — M25571 Pain in right ankle and joints of right foot: Secondary | ICD-10-CM

## 2020-04-10 NOTE — Therapy (Signed)
[facility] [address] [place], [address] Phone: [phone]   Fax:  [phone]  Physical Therapy Treatment  Patient Details  Name: Bion Todorov MRN: [id number] Date of Birth: [birth year] No data recorded  Encounter Date: 04/10/2020   PT End of Session - 04/10/20 1122    Visit Number 6    Number of Visits 17    Date for PT Re-Evaluation 05/02/20    PT Start Time 0945    PT Stop Time 1030    PT Time Calculation (min) 45 min    Activity Tolerance Patient tolerated treatment well;No increased pain           Past Medical History:  Diagnosis Date  . Diabetes (HCC)   . Diverticulitis   . Elevated hemoglobin A1c   . Elevated uric acid in blood   . Esophagitis   . Hypertension   . Low HDL (under 40)   . Overweight     History reviewed. No pertinent surgical history.  There were no vitals filed for this visit.   Subjective Assessment - 04/10/20 0944    Subjective Pt reports ankle has been feeling good. Tightness but does not feel pain. Pt reports small bumps around where a previous infection was.    Pertinent History Surgery on Posterior R heel    Limitations House hold activities;Other (comment)   Anything without boot   How long can you stand comfortably? NWB without boot, indefinitly with boot    How long can you walk comfortably? 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, Glass blower/designer as a hobby            Therapeutic Exercise: (45 min)  Isometric 4way ankle- 3x12  BAPS board- Level 4- Plantarflexion/dorsiflexion, 2 min Inverson/eversion, 10x CCW, 10x CW Heel raises- 2x12 Single Leg Stance: 3x45 sec Tandem balance with pregait calf raises - 2x10  Standing march on Airex- 2x12 TRX Squats- 2x12 Therapeutic exercise to increase ROM and increase strength       PT Education - 04/10/20 0949    Education Details  form/technique with exercise    Person(s) Educated Patient    Methods Explanation;Demonstration    Comprehension Verbalized understanding;Returned demonstration            PT Short Term Goals - 03/29/20 1133      PT SHORT TERM GOAL #1   Title Patient will show independence with HEP    Baseline 03/21/20- Currently dependent    Time 2    Period Weeks    Status New    Target Date 04/04/20             PT Long Term Goals - 03/29/20 1133      PT LONG TERM GOAL #1   Title Patient will be able to ambulate 5108ft without the use of CAM boot without increase in pain    Baseline 03/21/20- currently NWB    Time 6    Period Weeks    Status New      PT LONG TERM GOAL #2   Title Patient will have full AROM in Right ankle to show increased strength and ROM    Baseline 03/21/20- Plantarflexion: Right- 27 degrees, Left- 40 degrees Dorsiflexion: R- 7 degrees from resting position, 0 degrees AROM from 90 degrees, L- 22 degrees from resting position, 8 degrees from 90  Inversion- R- 22 degrees,  L-30 AROM, Eversion- R- 12 degrees AROM, L-22 degrees AROM    Time 6    Period Weeks    Status New      PT LONG TERM GOAL #3   Title Patient will increase FOTO score to 74 to show increase in functional ability in  Right LE    Baseline 03/21/20- 56    Time 6    Period Weeks    Status New                 Plan - 04/10/20 1035    Clinical Impression Statement Increased socus on LE strengthening. Patient tolerated exericses well without an increase in pain. Patient showed improvemnt in single leg balancing exercises. Patient has limited AROM plantar flexion and decreased AROM when perfomring heel raise indicating decreased strength. Patient will benefit from further skilled therapy to reutn to PLOF.    Examination-Activity Limitations Bathing;Stairs;Stand;Squat;Transfers;Locomotion Level;Lift;Carry    Examination-Participation Restrictions Occupation;Yard Work;Cleaning;Community Activity     Stability/Clinical Decision Making Stable/Uncomplicated    Rehab Potential Good    PT Frequency 2x / week    PT Duration 6 weeks    PT Treatment/Interventions ADLs/Self Care Home Management;Cryotherapy;Electrical Stimulation;Moist Heat;Gait training;Functional mobility training;Therapeutic activities;Therapeutic exercise;Balance training;Neuromuscular re-education;Manual techniques;Scar mobilization;Passive range of motion;Dry needling;Joint Manipulations    PT Next Visit Plan Continued focous on strength to improve AROM    PT Home Exercise Plan 4-Way ankle with GTB, Towel Squeezes, Wt shifts           Patient will benefit from skilled therapeutic intervention in order to improve the following deficits and impairments:  Abnormal gait,Decreased activity tolerance,Decreased endurance,Decreased mobility,Decreased range of motion,Difficulty walking,Hypomobility,Pain  Visit Diagnosis: Pain in right ankle and joints of right foot  Stiffness of right ankle, not elsewhere classified  Ankle weakness     Problem List Patient Active Problem List   Diagnosis Date Noted  . Polycythemia, secondary 11/26/2016    Silvano Rusk 04/10/2020, 11:24 AM Silvano Rusk SPT  [facility] PHYSICAL AND SPORTS MEDICINE [address] [place], [address] Phone: [phone]   Fax:  [phone]  Name: Nalin Mazzocco MRN: [id number] Date of Birth: 02/17/1970

## 2020-04-10 NOTE — Telephone Encounter (Signed)
Patient called and stated that he thinks his incision is getting infected again.  He states that he has had clear and bloody drainage and it started last Friday. Was swollen at top of incision, but has gone down.  He denies any redness, only slight tenderness.  He has an appt with you this Wednesday.  Do you want to start him on an antibiotic to cover any infection or have him continue to clean with alcohol and peroxide and Neosporin?

## 2020-04-12 ENCOUNTER — Encounter: Payer: Self-pay | Admitting: Podiatry

## 2020-04-12 ENCOUNTER — Other Ambulatory Visit: Payer: Self-pay

## 2020-04-12 ENCOUNTER — Ambulatory Visit (INDEPENDENT_AMBULATORY_CARE_PROVIDER_SITE_OTHER): Payer: 59 | Admitting: Podiatry

## 2020-04-12 ENCOUNTER — Ambulatory Visit: Payer: 59 | Attending: Podiatry

## 2020-04-12 ENCOUNTER — Other Ambulatory Visit: Payer: Self-pay | Admitting: Podiatry

## 2020-04-12 DIAGNOSIS — T8149XA Infection following a procedure, other surgical site, initial encounter: Secondary | ICD-10-CM

## 2020-04-12 DIAGNOSIS — M25571 Pain in right ankle and joints of right foot: Secondary | ICD-10-CM | POA: Diagnosis not present

## 2020-04-12 DIAGNOSIS — R29898 Other symptoms and signs involving the musculoskeletal system: Secondary | ICD-10-CM | POA: Diagnosis not present

## 2020-04-12 DIAGNOSIS — M21861 Other specified acquired deformities of right lower leg: Secondary | ICD-10-CM

## 2020-04-12 DIAGNOSIS — M25671 Stiffness of right ankle, not elsewhere classified: Secondary | ICD-10-CM | POA: Diagnosis not present

## 2020-04-12 DIAGNOSIS — M216X1 Other acquired deformities of right foot: Secondary | ICD-10-CM | POA: Diagnosis not present

## 2020-04-12 DIAGNOSIS — Z9889 Other specified postprocedural states: Secondary | ICD-10-CM | POA: Diagnosis not present

## 2020-04-12 DIAGNOSIS — M9261 Juvenile osteochondrosis of tarsus, right ankle: Secondary | ICD-10-CM | POA: Diagnosis not present

## 2020-04-12 DIAGNOSIS — M6528 Calcific tendinitis, other site: Secondary | ICD-10-CM

## 2020-04-12 MED ORDER — LEVOFLOXACIN 750 MG PO TABS: 750.0000 mg | ORAL_TABLET | Freq: Every day | ORAL | 0 refills | Status: DC

## 2020-04-12 NOTE — Therapy (Signed)
[facility]ORTS MEDICINE [address] [place], [address] Phone: [phone]   Fax:  [phone]  Physical Therapy Treatment  Patient Details  Name: Wayne Hernandez MRN: [id number] Date of Birth: [birth year] No data recorded  Encounter Date: 04/12/2020   PT End of Session - 04/12/20 1123    Visit Number 7    Number of Visits 17    Date for PT Re-Evaluation 05/02/20    PT Start Time 1030    PT Stop Time 1100    PT Time Calculation (min) 30 min    Activity Tolerance Patient tolerated treatment well;No increased pain    Behavior During Therapy WFL for tasks assessed/performed           Past Medical History:  Diagnosis Date  . Diabetes (HCC)   . Diverticulitis   . Elevated hemoglobin A1c   . Elevated uric acid in blood   . Esophagitis   . Hypertension   . Low HDL (under 40)   . Overweight     History reviewed. No pertinent surgical history.  There were no vitals filed for this visit.   Subjective Assessment - 04/12/20 1038    Subjective Pt reports ankle has been feeling good. Continues to feel the tightness in the ankle and walking is getting better but still not great.    Pertinent History Surgery on Posterior R heel    Limitations House hold activities;Other (comment)   Anything without boot   How long can you stand comfortably? NWB without boot, indefinitly with boot    How long can you walk comfortably? 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 No/denies           Therapeutic Exercise: (30 min) Isometric 4way ankle- 3x12  BAPS board- Level 4- Plantarflexion/dorsiflexion, 2 min Inverson/eversion, 10x CCW, 10x CW Single Leg Stance: 3x45 sec Standing march on Airex- 2x12 Hurdle walking- 3 hurdles, 6 times-focused on equal stance time on each leg and equal stride length Stair walking-  working on step-over-step gait on stairs Bilateral Calf stretch off of step- 3x30s Total gym calf raises- 2x15 (level 17) Therapeutic exercise to increase ROM and increase strength              PT Education - 04/12/20 1039    Education Details form/technique with exercise    Person(s) Educated Patient    Methods Explanation;Demonstration    Comprehension Returned demonstration;Verbalized understanding            PT Short Term Goals - 03/29/20 1133      PT SHORT TERM GOAL #1   Title Patient will show independence with HEP    Baseline 03/21/20- Currently dependent    Time 2    Period Weeks    Status New    Target Date 04/04/20             PT Long Term Goals - 03/29/20 1133      PT LONG TERM GOAL #1   Title Patient will be able to ambulate 519ft without the use of CAM boot without increase in pain    Baseline 03/21/20- currently NWB    Time 6    Period Weeks    Status New      PT LONG TERM GOAL #2   Title Patient will have full AROM in Right  ankle to show increased strength and ROM    Baseline 03/21/20- Plantarflexion: Right- 27 degrees, Left- 40 degrees Dorsiflexion: R- 7 degrees from resting position, 0 degrees AROM from 90 degrees, L- 22 degrees from resting position, 8 degrees from 90  Inversion- R- 22 degrees, L-30 AROM, Eversion- R- 12 degrees AROM, L-22 degrees AROM    Time 6    Period Weeks    Status New      PT LONG TERM GOAL #3   Title Patient will increase FOTO score to 74 to show increase in functional ability in  Right LE    Baseline 03/21/20- 56    Time 6    Period Weeks    Status New                 Plan - 04/12/20 1121    Clinical Impression Statement Continued focus on LE strengthening. Patient had increased pain and tightness in calf when performing exercises on airex foam. Symptoms improved with isomerics and PROM. Patient showed improvment in gait, and was able to increase stance time on right LE. Patient will benefit from further  skilled therapy to return to PLOF.    Examination-Activity Limitations Bathing;Stairs;Stand;Squat;Transfers;Locomotion Level;Lift;Carry    Examination-Participation Restrictions Occupation;Yard Work;Cleaning;Community Activity    Stability/Clinical Decision Making Stable/Uncomplicated    Rehab Potential Good    PT Frequency 2x / week    PT Duration 6 weeks    PT Treatment/Interventions ADLs/Self Care Home Management;Cryotherapy;Electrical Stimulation;Moist Heat;Gait training;Functional mobility training;Therapeutic activities;Therapeutic exercise;Balance training;Neuromuscular re-education;Manual techniques;Scar mobilization;Passive range of motion;Dry needling;Joint Manipulations    PT Next Visit Plan Continued focous on strength to improve AROM    PT Home Exercise Plan 4-Way ankle with GTB, Towel Squeezes, Wt shifts    Consulted and Agree with Plan of Care Patient           Patient will benefit from skilled therapeutic intervention in order to improve the following deficits and impairments:  Abnormal gait,Decreased activity tolerance,Decreased endurance,Decreased mobility,Decreased range of motion,Difficulty walking,Hypomobility,Pain  Visit Diagnosis: Pain in right ankle and joints of right foot  Stiffness of right ankle, not elsewhere classified  Ankle weakness     Problem List Patient Active Problem List   Diagnosis Date Noted  . Polycythemia, secondary 11/26/2016    Silvano Rusk 04/12/2020, 11:26 AM Silvano Rusk SPT  [facility] PHYSICAL AND SPORTS MEDICINE [address] [place], [address] Phone: [phone]   Fax:  [phone]  Name: Christop Hippert MRN: [id number] Date of Birth: 1970-10-16

## 2020-04-16 NOTE — Progress Notes (Signed)
  Subjective:  Patient ID: Wayne Hernandez, male    DOB: August 22, 1970,  MRN: [id number]  Chief Complaint  Patient presents with  . Routine Post Op    He comes in today concerned for infection to incision started draining 5 days ago  He denies any fever/chills    DOS: 12/31/2019 Procedure: Debridement Achilles tendon, resection heel spur posterior, Haglund's resection, gastrocnemius recession  50 y.o. male returns for post-op check.  Doing well.  Has been finished with his antibiotics, he returns a week early because he had some drainage from the wound.  Review of Systems: Negative except as noted in the HPI. Denies N/V/F/Ch.   Objective:  There were no vitals filed for this visit. There is no height or weight on file to calculate BMI. Constitutional Well developed. Well nourished.  Vascular Foot warm and well perfused. Capillary refill normal to all digits.   Neurologic Normal speech. Oriented to person, place, and time. Epicritic sensation to light touch grossly present bilaterally.  Dermatologic  incisions have completely healed now.  There is a small draining sinus, superficial does not penetrate deep serous drainage.  Orthopedic: Tenderness to palpation noted about the surgical site.   Radiographs: Right foot nonweightbearing films: Interval resection of posterior calcaneal enthesophyte and Haglund's deformity  Wound culture 12/22 with heavy growth of pansensitive Pseudomonas aeruginosa Assessment:   1. Postoperative wound abscess    Plan:  Patient was evaluated and treated and all questions answered.  S/p foot surgery right -From a strength and rehabilitation standpoint, his Achilles is very strong and he continues to do quite well with physical therapy. -He did have a small draining area today, I think this likely is a suture abscess which I discussed with him.  As a precaution I debrided the area, applied a bandage and took a culture of the drainage. -I think she should  resume his levofloxacin, I sent him a new prescription for this. -Considering the persistent drainage.  I think we should consider an MRI as well to evaluate for deeper infection.  Do not want this to persist any further to develop risk of infection in the tendon or bone.   No follow-ups on file.

## 2020-04-17 ENCOUNTER — Ambulatory Visit: Payer: 59

## 2020-04-17 ENCOUNTER — Other Ambulatory Visit: Payer: Self-pay

## 2020-04-17 DIAGNOSIS — M25571 Pain in right ankle and joints of right foot: Secondary | ICD-10-CM

## 2020-04-17 DIAGNOSIS — R29898 Other symptoms and signs involving the musculoskeletal system: Secondary | ICD-10-CM

## 2020-04-17 DIAGNOSIS — M25671 Stiffness of right ankle, not elsewhere classified: Secondary | ICD-10-CM

## 2020-04-17 NOTE — Therapy (Signed)
[place] [facility] [address], [place], [address] Phone: [phone]   Fax:  [phone]  Physical Therapy Treatment  Patient Details  Name: Praveen Coia MRN: [id number] Date of Birth: [birth year] No data recorded  Encounter Date: 04/17/2020   PT End of Session - 04/17/20 1057    Visit Number 8    Number of Visits 17    Date for PT Re-Evaluation 05/02/20    PT Start Time 1030    PT Stop Time 1115    PT Time Calculation (min) 45 min    Activity Tolerance Patient tolerated treatment well;No increased pain    Behavior During Therapy WFL for tasks assessed/performed           Past Medical History:  Diagnosis Date  . Diabetes (HCC)   . Diverticulitis   . Elevated hemoglobin A1c   . Elevated uric acid in blood   . Esophagitis   . Hypertension   . Low HDL (under 40)   . Overweight     History reviewed. No pertinent surgical history.  There were no vitals filed for this visit.   Subjective Assessment - 04/17/20 1055    Subjective Patient reports he was sore along the calf for 2 days after the previous session. Patient states increased tightness along the posterior aspect of his heel.    Pertinent History Surgery on Posterior R heel    Limitations House hold activities;Other (comment)   Anything without boot   How long can you stand comfortably? NWB without boot, indefinitly with boot    How long can you walk comfortably? 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, Glass blower/designer as a hobby             TREATMENT   Therapeutic Exercise:  Long holds of plantarflexion for improving AROM - 30sec holds x 2 Knee to wall soleus stretch/minor loading - 2 x 20  Heel raises to max height - with 1 sec holds - 2 x 20 Knees bent heel raises with UE support - 2 x 5  BAPS board- Level 4- Plantarflexion/dorsiflexion, 2 min Inverson/eversion, 10x  CCW, 10x CW Plantarflexion stretch in standing with foot positioned behind body - 2 x 45 Single Leg Stance: 3x30 sec on airex pad Heel raises with staggard stance with focus on speed to improve power/strength - 2 x 10   Therapeutic exercise to increase ROM and increase strength        PT Education - 04/17/20 1057    Education Details form/technique with exercise    Person(s) Educated Patient    Methods Explanation;Demonstration    Comprehension Verbalized understanding;Returned demonstration            PT Short Term Goals - 03/29/20 1133      PT SHORT TERM GOAL #1   Title Patient will show independence with HEP    Baseline 03/21/20- Currently dependent    Time 2    Period Weeks    Status New    Target Date 04/04/20             PT Long Term Goals - 03/29/20 1133      PT LONG TERM GOAL #1   Title Patient will be able to ambulate 533ft without the use of CAM boot without increase in pain    Baseline 03/21/20- currently NWB    Time 6  Period Weeks    Status New      PT LONG TERM GOAL #2   Title Patient will have full AROM in Right ankle to show increased strength and ROM    Baseline 03/21/20- Plantarflexion: Right- 27 degrees, Left- 40 degrees Dorsiflexion: R- 7 degrees from resting position, 0 degrees AROM from 90 degrees, L- 22 degrees from resting position, 8 degrees from 90  Inversion- R- 22 degrees, L-30 AROM, Eversion- R- 12 degrees AROM, L-22 degrees AROM    Time 6    Period Weeks    Status New      PT LONG TERM GOAL #3   Title Patient will increase FOTO score to 74 to show increase in functional ability in  Right LE    Baseline 03/21/20- 56    Time 6    Period Weeks    Status New                 Plan - 04/17/20 1105    Clinical Impression Statement Performed greater amount of exercises to address plantarflexion strength along the gastroc and soleus to improve push off part of gait. Patient with significant increase in gastroc weakness, address this  during the session; this seems to be the main limitation in terms of function. Patient will benefit from further skilled therapy to return to prior level of function.    Examination-Activity Limitations Bathing;Stairs;Stand;Squat;Transfers;Locomotion Level;Lift;Carry    Examination-Participation Restrictions Occupation;Yard Work;Cleaning;Community Activity    Stability/Clinical Decision Making Stable/Uncomplicated    Rehab Potential Good    PT Frequency 2x / week    PT Duration 6 weeks    PT Treatment/Interventions ADLs/Self Care Home Management;Cryotherapy;Electrical Stimulation;Moist Heat;Gait training;Functional mobility training;Therapeutic activities;Therapeutic exercise;Balance training;Neuromuscular re-education;Manual techniques;Scar mobilization;Passive range of motion;Dry needling;Joint Manipulations    PT Next Visit Plan Continued focous on strength to improve AROM    PT Home Exercise Plan 4-Way ankle with GTB, Towel Squeezes, Wt shifts    Consulted and Agree with Plan of Care Patient           Patient will benefit from skilled therapeutic intervention in order to improve the following deficits and impairments:  Abnormal gait,Decreased activity tolerance,Decreased endurance,Decreased mobility,Decreased range of motion,Difficulty walking,Hypomobility,Pain  Visit Diagnosis: Pain in right ankle and joints of right foot  Stiffness of right ankle, not elsewhere classified  Ankle weakness     Problem List Patient Active Problem List   Diagnosis Date Noted  . Polycythemia, secondary 11/26/2016    Myrene Galas, PT DPT 04/17/2020, 11:15 AM  [place] [facility] PHYSICAL AND SPORTS MEDICINE [address], [place], [address] Phone: [phone]   Fax:  [phone]  Name: Salih Williamson MRN: [id number] Date of Birth: [birth year]

## 2020-04-18 LAB — WOUND CULTURE: Organism ID, Bacteria: NONE SEEN

## 2020-04-19 ENCOUNTER — Ambulatory Visit: Payer: 59

## 2020-04-19 ENCOUNTER — Encounter: Payer: Self-pay | Admitting: Podiatry

## 2020-04-19 ENCOUNTER — Ambulatory Visit (INDEPENDENT_AMBULATORY_CARE_PROVIDER_SITE_OTHER): Payer: 59 | Admitting: Podiatry

## 2020-04-19 ENCOUNTER — Other Ambulatory Visit: Payer: Self-pay

## 2020-04-19 DIAGNOSIS — R29898 Other symptoms and signs involving the musculoskeletal system: Secondary | ICD-10-CM | POA: Diagnosis not present

## 2020-04-19 DIAGNOSIS — M25671 Stiffness of right ankle, not elsewhere classified: Secondary | ICD-10-CM | POA: Diagnosis not present

## 2020-04-19 DIAGNOSIS — T8149XA Infection following a procedure, other surgical site, initial encounter: Secondary | ICD-10-CM

## 2020-04-19 DIAGNOSIS — M25571 Pain in right ankle and joints of right foot: Secondary | ICD-10-CM

## 2020-04-19 DIAGNOSIS — M21861 Other specified acquired deformities of right lower leg: Secondary | ICD-10-CM

## 2020-04-19 DIAGNOSIS — Z9889 Other specified postprocedural states: Secondary | ICD-10-CM

## 2020-04-19 DIAGNOSIS — M9261 Juvenile osteochondrosis of tarsus, right ankle: Secondary | ICD-10-CM

## 2020-04-19 DIAGNOSIS — M216X1 Other acquired deformities of right foot: Secondary | ICD-10-CM

## 2020-04-19 DIAGNOSIS — M6528 Calcific tendinitis, other site: Secondary | ICD-10-CM

## 2020-04-19 NOTE — Therapy (Signed)
[place] [facility] [address] [place], [address] Phone: [phone]   Fax:  [phone]  Physical Therapy Treatment  Patient Details  Name: Wayne Hernandez MRN: [id number] Date of Birth: 01/22/1971 No data recorded  Encounter Date: 04/19/2020   PT End of Session - 04/19/20 1039    Visit Number 9    Number of Visits 17    Date for PT Re-Evaluation 05/02/20    PT Start Time 1030    PT Stop Time 1115    PT Time Calculation (min) 45 min    Activity Tolerance Patient tolerated treatment well;No increased pain    Behavior During Therapy WFL for tasks assessed/performed           Past Medical History:  Diagnosis Date  . Diabetes (HCC)   . Diverticulitis   . Elevated hemoglobin A1c   . Elevated uric acid in blood   . Esophagitis   . Hypertension   . Low HDL (under 40)   . Overweight     History reviewed. No pertinent surgical history.  There were no vitals filed for this visit.   Subjective Assessment - 04/19/20 1036    Subjective Patient reports he returned to the physician that performed the surgery. States he wants to perform an MRI secondary to a potential infection in the ankle.    Pertinent History Surgery on Posterior R heel    Limitations House hold activities;Other (comment)   Anything without boot   How long can you stand comfortably? NWB without boot, indefinitly with boot    How long can you walk comfortably? 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 No/denies               TREATMENT   Therapeutic Exercise:  Lunges in standing Plantarflexion/ dorsiflexion with foot on wobble board Knee to wall soleus stretch/minor loading - 2 x 20  Inversion/eversion with foot on wobble board Single Leg Stance: 3x30 sec on airex pad BAPS board- Level 5- Plantarflexion/dorsiflexion, 2  min Inverson/eversion, 10x CCW, 10x CW Side stepping across airex beam - without UE support - x 10 B (~50ft)   Therapeutic exercise to increase ROM and increase strength    PT Education - 04/19/20 1039    Education Details form/technique with exercise    Person(s) Educated Patient    Methods Explanation;Demonstration    Comprehension Verbalized understanding;Returned demonstration            PT Short Term Goals - 03/29/20 1133      PT SHORT TERM GOAL #1   Title Patient will show independence with HEP    Baseline 03/21/20- Currently dependent    Time 2    Period Weeks    Status New    Target Date 04/04/20             PT Long Term Goals - 03/29/20 1133      PT LONG TERM GOAL #1   Title Patient will be able to ambulate 537ft without the use of CAM boot without increase in pain    Baseline 03/21/20- currently NWB    Time 6    Period Weeks    Status New      PT LONG TERM GOAL #2   Title Patient will have full AROM in Right ankle to show increased strength  and ROM    Baseline 03/21/20- Plantarflexion: Right- 27 degrees, Left- 40 degrees Dorsiflexion: R- 7 degrees from resting position, 0 degrees AROM from 90 degrees, L- 22 degrees from resting position, 8 degrees from 90  Inversion- R- 22 degrees, L-30 AROM, Eversion- R- 12 degrees AROM, L-22 degrees AROM    Time 6    Period Weeks    Status New      PT LONG TERM GOAL #3   Title Patient will increase FOTO score to 74 to show increase in functional ability in  Right LE    Baseline 03/21/20- 56    Time 6    Period Weeks    Status New                 Plan - 04/19/20 1103    Clinical Impression Statement Patient with increased soreness dyring today's session and thus performed exercises in sitting to allow for greater rest periods between exercises. Patient demonstrates increased difficulty with performign walking secondary to soreness however, this is improved with soleus stretching indicating soleus involvement. Patient  will benefit from furhter skilled threapy to return to prior level of function.    Examination-Activity Limitations Bathing;Stairs;Stand;Squat;Transfers;Locomotion Level;Lift;Carry    Examination-Participation Restrictions Occupation;Yard Work;Cleaning;Community Activity    Stability/Clinical Decision Making Stable/Uncomplicated    Rehab Potential Good    PT Frequency 2x / week    PT Duration 6 weeks    PT Treatment/Interventions ADLs/Self Care Home Management;Cryotherapy;Electrical Stimulation;Moist Heat;Gait training;Functional mobility training;Therapeutic activities;Therapeutic exercise;Balance training;Neuromuscular re-education;Manual techniques;Scar mobilization;Passive range of motion;Dry needling;Joint Manipulations    PT Next Visit Plan Continued focous on strength to improve AROM    PT Home Exercise Plan 4-Way ankle with GTB, Towel Squeezes, Wt shifts    Consulted and Agree with Plan of Care Patient           Patient will benefit from skilled therapeutic intervention in order to improve the following deficits and impairments:  Abnormal gait,Decreased activity tolerance,Decreased endurance,Decreased mobility,Decreased range of motion,Difficulty walking,Hypomobility,Pain  Visit Diagnosis: Ankle weakness  Stiffness of right ankle, not elsewhere classified  Pain in right ankle and joints of right foot     Problem List Patient Active Problem List   Diagnosis Date Noted  . Polycythemia, secondary 11/26/2016    Myrene Galas, PT DPT 04/19/2020, 1:14 PM  [place] [facility] PHYSICAL AND SPORTS MEDICINE [address] [place], [address] Phone: [phone]   Fax:  [phone]  Name: Everton Bertha MRN: [id number] Date of Birth: March 28, 1970

## 2020-04-19 NOTE — Progress Notes (Signed)
  Subjective:  Patient ID: Wayne Hernandez, male    DOB: 06/04/1970,  MRN: [id number]  Chief Complaint  Patient presents with  . Wound Check    "Its still draining"    DOS: 12/31/2019 Procedure: Debridement Achilles tendon, resection heel spur posterior, Haglund's resection, gastrocnemius recession  50 y.o. male returns for post-op check.  Has been taking the antibiotic.  Thinks it has improved somewhat but still having drainage in the bandage.  MRI scheduled for 3/17  Review of Systems: Negative except as noted in the HPI. Denies N/V/F/Ch.   Objective:  There were no vitals filed for this visit. There is no height or weight on file to calculate BMI. Constitutional Well developed. Well nourished.  Vascular Foot warm and well perfused. Capillary refill normal to all digits.   Neurologic Normal speech. Oriented to person, place, and time. Epicritic sensation to light touch grossly present bilaterally.  Dermatologic  incisions have completely healed now.  There is a small draining sinus, superficial does not penetrate deep serous drainage.  Orthopedic: Tenderness to palpation noted about the surgical site.   Radiographs: Right foot nonweightbearing films: Interval resection of posterior calcaneal enthesophyte and Haglund's deformity  Wound culture 12/22 with heavy growth of pansensitive Pseudomonas aeruginosa  New wound culture 04/12/2020 Pseudomonas aeruginosa pansensitive Assessment:   1. Postoperative wound abscess   2. Acquired Haglund's deformity of right heel   3. Gastrocnemius equinus of right lower extremity   4. 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 S/P foot surgery, right    Plan:  Patient was evaluated and treated and all questions answered.  S/p foot surgery right -Continue physical therapy as long as he is improving -Continue levofloxacin -MRI scheduled for 3/17.  I think likely will need to I&D and excised the sinus tract hopefully nothing  more.  Will reevaluate once we have MRI results at next visit   Return in 12 days (on 05/01/2020) for wound re-check.

## 2020-04-20 ENCOUNTER — Encounter: Payer: Self-pay | Admitting: *Deleted

## 2020-04-24 ENCOUNTER — Other Ambulatory Visit: Payer: Self-pay

## 2020-04-24 ENCOUNTER — Ambulatory Visit: Payer: 59

## 2020-04-24 DIAGNOSIS — R29898 Other symptoms and signs involving the musculoskeletal system: Secondary | ICD-10-CM | POA: Diagnosis not present

## 2020-04-24 DIAGNOSIS — M25571 Pain in right ankle and joints of right foot: Secondary | ICD-10-CM | POA: Diagnosis not present

## 2020-04-24 DIAGNOSIS — M25671 Stiffness of right ankle, not elsewhere classified: Secondary | ICD-10-CM | POA: Diagnosis not present

## 2020-04-25 NOTE — Therapy (Signed)
[place] [facility] [address] [place], [address] Phone: [phone]   Fax:  [phone]  Physical Therapy Treatment  Patient Details  Name: Romulo Okray MRN: [id number] Date of Birth: 1970-03-13 No data recorded  Encounter Date: 04/24/2020   PT End of Session - 04/25/20 0929    Visit Number 10    Number of Visits 17    Date for PT Re-Evaluation 05/02/20    PT Start Time 1030    PT Stop Time 1115    PT Time Calculation (min) 45 min    Activity Tolerance Patient tolerated treatment well;No increased pain    Behavior During Therapy WFL for tasks assessed/performed           Past Medical History:  Diagnosis Date  . Diabetes (HCC)   . Diverticulitis   . Elevated hemoglobin A1c   . Elevated uric acid in blood   . Esophagitis   . Hypertension   . Low HDL (under 40)   . Overweight     History reviewed. No pertinent surgical history.  There were no vitals filed for this visit.   Subjective Assessment - 04/25/20 0924    Subjective Patient states no major changes since the previous session, states he found out he has a infection in his wound.    Pertinent History Surgery on Posterior R heel    Limitations House hold activities;Other (comment)   Anything without boot   How long can you stand comfortably? NWB without boot, indefinitly with boot    How long can you walk comfortably? 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 No/denies              TREATMENT  Manual Therapy STM performed to the gastrocnemius with positioned in long sitting to decrease increased spasms and pain along the affected calf  While performing STM, dry needling performed to the gastroc/soleus along   Therapeutic Exercise:  BAPS board- Level 5- Plantarflexion/dorsiflexion, 3 min Inverson/eversion, 20x  CCW/CW Knee to wall soleus stretch/minor loading - x15 Single Leg Stance: 3x30 sec on airex pad Squats performed on bosu ball - x 20 Without UE support    Therapeutic exercise to increase ROM and increase strength      PT Education - 04/25/20 0929    Education Details form/technique with exercie    Person(s) Educated Patient    Methods Explanation;Demonstration    Comprehension Verbalized understanding;Returned demonstration            PT Short Term Goals - 03/29/20 1133      PT SHORT TERM GOAL #1   Title Patient will show independence with HEP    Baseline 03/21/20- Currently dependent    Time 2    Period Weeks    Status New    Target Date 04/04/20             PT Long Term Goals - 03/29/20 1133      PT LONG TERM GOAL #1   Title Patient will be able to ambulate 581ft without the use of CAM boot without increase in pain    Baseline 03/21/20- currently NWB    Time 6    Period Weeks    Status New      PT LONG TERM GOAL #2   Title Patient will have full AROM in  Right ankle to show increased strength and ROM    Baseline 03/21/20- Plantarflexion: Right- 27 degrees, Left- 40 degrees Dorsiflexion: R- 7 degrees from resting position, 0 degrees AROM from 90 degrees, L- 22 degrees from resting position, 8 degrees from 90  Inversion- R- 22 degrees, L-30 AROM, Eversion- R- 12 degrees AROM, L-22 degrees AROM    Time 6    Period Weeks    Status New      PT LONG TERM GOAL #3   Title Patient will increase FOTO score to 74 to show increase in functional ability in  Right LE    Baseline 03/21/20- 56    Time 6    Period Weeks    Status New                 Plan - 04/25/20 0930    Clinical Impression Statement Patient demonstrates decreased SLS time on compliance surface most notably with increased postural sway and poor inversion/eversion strength. Patient is improving overall, however has some weakness along the affected plantarflexors. Patient will benefit from further skilled  therapy focused on improving strength to return to prior level of function.    Examination-Activity Limitations Bathing;Stairs;Stand;Squat;Transfers;Locomotion Level;Lift;Carry    Examination-Participation Restrictions Occupation;Yard Work;Cleaning;Community Activity    Stability/Clinical Decision Making Stable/Uncomplicated    Rehab Potential Good    PT Frequency 2x / week    PT Duration 6 weeks    PT Treatment/Interventions ADLs/Self Care Home Management;Cryotherapy;Electrical Stimulation;Moist Heat;Gait training;Functional mobility training;Therapeutic activities;Therapeutic exercise;Balance training;Neuromuscular re-education;Manual techniques;Scar mobilization;Passive range of motion;Dry needling;Joint Manipulations    PT Next Visit Plan Continued focous on strength to improve AROM    PT Home Exercise Plan 4-Way ankle with GTB, Towel Squeezes, Wt shifts    Consulted and Agree with Plan of Care Patient           Patient will benefit from skilled therapeutic intervention in order to improve the following deficits and impairments:  Abnormal gait,Decreased activity tolerance,Decreased endurance,Decreased mobility,Decreased range of motion,Difficulty walking,Hypomobility,Pain  Visit Diagnosis: Stiffness of right ankle, not elsewhere classified  Pain in right ankle and joints of right foot     Problem List Patient Active Problem List   Diagnosis Date Noted  . Polycythemia, secondary 11/26/2016    Myrene Galas, PT DPT 04/25/2020, 9:35 AM  [place] [facility] AND SPORTS MEDICINE [address] [place], [address] Phone: [phone]   Fax:  [phone]  Name: Ellery Meroney MRN: [id number] Date of Birth: June 03, 1970

## 2020-04-26 ENCOUNTER — Ambulatory Visit: Payer: 59

## 2020-04-26 ENCOUNTER — Other Ambulatory Visit: Payer: Self-pay

## 2020-04-26 DIAGNOSIS — M25571 Pain in right ankle and joints of right foot: Secondary | ICD-10-CM

## 2020-04-26 DIAGNOSIS — M25671 Stiffness of right ankle, not elsewhere classified: Secondary | ICD-10-CM

## 2020-04-26 DIAGNOSIS — R29898 Other symptoms and signs involving the musculoskeletal system: Secondary | ICD-10-CM | POA: Diagnosis not present

## 2020-04-26 NOTE — Therapy (Signed)
[facility] [address], [place], [address] Phone: [phone]   Fax:  [phone]  Physical Therapy Treatment  Patient Details  Name: Wayne Hernandez MRN: [id number] Date of Birth: 03-28-1970 No data recorded  Encounter Date: 04/26/2020   PT End of Session - 04/26/20 1030    Visit Number 11    Number of Visits 17    Date for PT Re-Evaluation 05/02/20    PT Start Time 1030    PT Stop Time 1115    PT Time Calculation (min) 45 min    Activity Tolerance Patient tolerated treatment well;No increased pain    Behavior During Therapy WFL for tasks assessed/performed           Past Medical History:  Diagnosis Date  . Diabetes (HCC)   . Diverticulitis   . Elevated hemoglobin A1c   . Elevated uric acid in blood   . Esophagitis   . Hypertension   . Low HDL (under 40)   . Overweight     No past surgical history on file.  There were no vitals filed for this visit.   Subjective Assessment - 04/26/20 1139    Subjective Patient states no major changes since the previous session. Patient has a MRI on Monday, to further assess infection in his wound. Patient did report feeling better after being dry needled during the last session.    Pertinent History Surgery on Posterior R heel    Limitations House hold activities;Other (comment)    How long can you stand comfortably? NWB without boot, indefinitly with boot    How long can you walk comfortably? 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 No/denies           TREATMENT - Objective  Therapeutic Exercise - calf raises with knee flexed holding onto a support surface 2 sets x15  - Calf raises 2 sets x10  - Calf stretches with towel  2 sets x10  - BAPS board- Level 5- Plantarflexion/dorsiflexion , 20x CCW/CW - Knee to wall soleus stretch/minor  loading - x15 - Single Leg Stance: 3x30 sec on airex pad       -     Squats performed on bosu ball - x 20 Without UE support  Performed exercises to address LE weakness      PT Education - 04/26/20 1032    Education Details form/ technique with exercise    Person(s) Educated Patient    Methods Explanation;Demonstration    Comprehension Verbalized understanding;Returned demonstration            PT Short Term Goals - 03/29/20 1133      PT SHORT TERM GOAL #1   Title Patient will show independence with HEP    Baseline 03/21/20- Currently dependent    Time 2    Period Weeks    Status New    Target Date 04/04/20             PT Long Term Goals - 03/29/20 1133      PT LONG TERM GOAL #1   Title Patient will be able to ambulate 539ft without the use of CAM boot without increase in pain    Baseline 03/21/20- currently NWB    Time 6    Period Weeks    Status New  PT LONG TERM GOAL #2   Title Patient will have full AROM in Right ankle to show increased strength and ROM    Baseline 03/21/20- Plantarflexion: Right- 27 degrees, Left- 40 degrees Dorsiflexion: R- 7 degrees from resting position, 0 degrees AROM from 90 degrees, L- 22 degrees from resting position, 8 degrees from 90  Inversion- R- 22 degrees, L-30 AROM, Eversion- R- 12 degrees AROM, L-22 degrees AROM    Time 6    Period Weeks    Status New      PT LONG TERM GOAL #3   Title Patient will increase FOTO score to 74 to show increase in functional ability in  Right LE    Baseline 03/21/20- 56    Time 6    Period Weeks    Status New                 Plan - 04/26/20 1134    Clinical Impression Statement Patient demonstrates decreased postural sway when squatting on unstable surface and is improving with stability and proprioception. Patient still has some muscular weakness in the gastroc/soleus after prolonged physical activity such as performing heel raises in standing. Further weakness noted by inability to  perform single leg heel raise. Patient will benefit from skilled therapy focusing on improving strength to return to prior level of function.    Examination-Activity Limitations Bathing;Stairs;Stand;Squat;Transfers;Locomotion Level;Lift;Carry    Examination-Participation Restrictions Occupation;Yard Work;Cleaning;Community Activity    Stability/Clinical Decision Making Stable/Uncomplicated    Rehab Potential Good    PT Frequency 2x / week    PT Duration 6 weeks    PT Treatment/Interventions ADLs/Self Care Home Management;Cryotherapy;Electrical Stimulation;Moist Heat;Gait training;Functional mobility training;Therapeutic activities;Therapeutic exercise;Balance training;Neuromuscular re-education;Manual techniques;Scar mobilization;Passive range of motion;Dry needling;Joint Manipulations    PT Next Visit Plan Continued focous on strength to improve AROM    PT Home Exercise Plan 4-Way ankle with GTB, Towel Squeezes, Wt shifts    Consulted and Agree with Plan of Care Patient           Patient will benefit from skilled therapeutic intervention in order to improve the following deficits and impairments:  Abnormal gait,Decreased activity tolerance,Decreased endurance,Decreased mobility,Decreased range of motion,Difficulty walking,Hypomobility,Pain  Visit Diagnosis: Stiffness of right ankle, not elsewhere classified  Pain in right ankle and joints of right foot     Problem List Patient Active Problem List   Diagnosis Date Noted  . Polycythemia, secondary 11/26/2016    Myrene Galas, PT DPT 04/26/2020, 4:56 PM  [place] [facility] SP[facility]TS [facility]IC[facility] [address] [place], [address] Phone: [phone]   Fax:  [phone]  Name: Wayne Hernandez MRN: [id number] Date of Birth: [birth year]

## 2020-04-27 ENCOUNTER — Other Ambulatory Visit: Payer: 59

## 2020-04-30 ENCOUNTER — Other Ambulatory Visit: Payer: Self-pay | Admitting: Podiatry

## 2020-04-30 DIAGNOSIS — T8149XA Infection following a procedure, other surgical site, initial encounter: Secondary | ICD-10-CM

## 2020-05-01 ENCOUNTER — Ambulatory Visit (INDEPENDENT_AMBULATORY_CARE_PROVIDER_SITE_OTHER): Payer: 59 | Admitting: Podiatry

## 2020-05-01 ENCOUNTER — Encounter: Payer: Self-pay | Admitting: Podiatry

## 2020-05-01 ENCOUNTER — Other Ambulatory Visit: Payer: Self-pay

## 2020-05-01 ENCOUNTER — Ambulatory Visit: Payer: 59

## 2020-05-01 DIAGNOSIS — M21861 Other specified acquired deformities of right lower leg: Secondary | ICD-10-CM

## 2020-05-01 DIAGNOSIS — M25571 Pain in right ankle and joints of right foot: Secondary | ICD-10-CM | POA: Diagnosis not present

## 2020-05-01 DIAGNOSIS — M25671 Stiffness of right ankle, not elsewhere classified: Secondary | ICD-10-CM | POA: Diagnosis not present

## 2020-05-01 DIAGNOSIS — M6528 Calcific tendinitis, other site: Secondary | ICD-10-CM

## 2020-05-01 DIAGNOSIS — R29898 Other symptoms and signs involving the musculoskeletal system: Secondary | ICD-10-CM | POA: Diagnosis not present

## 2020-05-01 DIAGNOSIS — T8149XA Infection following a procedure, other surgical site, initial encounter: Secondary | ICD-10-CM

## 2020-05-01 DIAGNOSIS — Z9889 Other specified postprocedural states: Secondary | ICD-10-CM

## 2020-05-01 DIAGNOSIS — M216X1 Other acquired deformities of right foot: Secondary | ICD-10-CM

## 2020-05-01 DIAGNOSIS — M9261 Juvenile osteochondrosis of tarsus, right ankle: Secondary | ICD-10-CM

## 2020-05-01 NOTE — Therapy (Signed)
[facility] [address] [place], [address] Phone: [phone]   Fax:  [phone]  Physical Therapy Treatment  Patient Details  Name: Wayne Hernandez MRN: [id number] Date of Birth: 1970-03-19 No data recorded  Encounter Date: 05/01/2020   PT End of Session - 05/01/20 1124    Visit Number 12    Number of Visits 17    Date for PT Re-Evaluation 05/02/20    PT Start Time 1030    PT Stop Time 1113    PT Time Calculation (min) 43 min    Activity Tolerance Patient tolerated treatment well;No increased pain    Behavior During Therapy WFL for tasks assessed/performed           Past Medical History:  Diagnosis Date  . Diabetes (HCC)   . Diverticulitis   . Elevated hemoglobin A1c   . Elevated uric acid in blood   . Esophagitis   . Hypertension   . Low HDL (under 40)   . Overweight     History reviewed. No pertinent surgical history.  There were no vitals filed for this visit.   Subjective Assessment - 05/01/20 1116    Subjective Patient reports aggravating ankle pain yesterday after working. Patient stated that he felt a slight loss of balance when bending over doing activities while working. However, patient reports that he feels better today.Patient states that wound stopped draining Friday morning. Patient had a MRI scheduled today but is now rescheduled between today and Wednesday.    Pertinent History Surgery on Posterior R heel    Limitations House hold activities;Other (comment)    How long can you stand comfortably? NWB without boot, indefinitly with boot    How long can you walk comfortably? 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 No/denies            TREATMENT  Therapeutic Exercise  Bent knee calf raises 2 sets x15   Calf raises 2 sets x10   Calf stretches with towel 2  sets x10   BAPS board- Level 5- Plantarflexion/dorsiflexion 20x CCW/CW  Knee to wall soleus stretch/minor loading - x15  Single Leg Stance: 3x30 sec on airex pad      Squats performed on bosu ball - x 20 Without UE support  Total gym level 8 single leg calf raises 3 x 10  Performed exercises to address LE weakness            PT Education - 05/01/20 1123    Education Details form/technique with exercise    Person(s) Educated Patient    Methods Explanation;Demonstration    Comprehension Returned demonstration;Verbalized understanding            PT Short Term Goals - 03/29/20 1133      PT SHORT TERM GOAL #1   Title Patient will show independence with HEP    Baseline 03/21/20- Currently dependent    Time 2    Period Weeks    Status New    Target Date 04/04/20             PT Long Term Goals - 03/29/20 1133      PT LONG TERM GOAL #1   Title Patient will be able to ambulate 5107ft without the use of CAM boot without increase in pain    Baseline 03/21/20-  currently NWB    Time 6    Period Weeks    Status New      PT LONG TERM GOAL #2   Title Patient will have full AROM in Right ankle to show increased strength and ROM    Baseline 03/21/20- Plantarflexion: Right- 27 degrees, Left- 40 degrees Dorsiflexion: R- 7 degrees from resting position, 0 degrees AROM from 90 degrees, L- 22 degrees from resting position, 8 degrees from 90  Inversion- R- 22 degrees, L-30 AROM, Eversion- R- 12 degrees AROM, L-22 degrees AROM    Time 6    Period Weeks    Status New      PT LONG TERM GOAL #3   Title Patient will increase FOTO score to 74 to show increase in functional ability in  Right LE    Baseline 03/21/20- 56    Time 6    Period Weeks    Status New                 Plan - 05/01/20 1127    Clinical Impression Statement Patient demonstrates improvement in ankle ROM while performing exercise on the BAPS board. Patient still presents with increased diffuclty and  weakness in gastroc/soleus especially when trying to engage in single leg calf raises on R LE and is currently unable to perform a single leg calf raise on the affected side. Patient will benefit from skilled physical therapy to improve strength and return prior level of function.    Personal Factors and Comorbidities --    Examination-Activity Limitations Bathing;Stairs;Stand;Squat;Transfers;Locomotion Level;Lift;Carry    Examination-Participation Restrictions Occupation;Yard Work;Cleaning;Community Activity    Stability/Clinical Decision Making Stable/Uncomplicated    Rehab Potential Good    PT Frequency 2x / week    PT Duration 6 weeks    PT Treatment/Interventions ADLs/Self Care Home Management;Cryotherapy;Electrical Stimulation;Moist Heat;Gait training;Functional mobility training;Therapeutic activities;Therapeutic exercise;Balance training;Neuromuscular re-education;Manual techniques;Scar mobilization;Passive range of motion;Dry needling;Joint Manipulations    PT Next Visit Plan Continued focous on strength to improve AROM    PT Home Exercise Plan 4-Way ankle with GTB, Towel Squeezes, Wt shifts    Consulted and Agree with Plan of Care Patient           Patient will benefit from skilled therapeutic intervention in order to improve the following deficits and impairments:  Abnormal gait,Decreased activity tolerance,Decreased endurance,Decreased mobility,Decreased range of motion,Difficulty walking,Hypomobility,Pain  Visit Diagnosis: Stiffness of right ankle, not elsewhere classified  Pain in right ankle and joints of right foot  Ankle weakness     Problem List Patient Active Problem List   Diagnosis Date Noted  . Polycythemia, secondary 11/26/2016    Marden Noble, SPT 05/01/2020, 11:42 AM  [place] [facility] SPORTS [facility]IC[facility] [address], [place], [address] Phone: [phone]   Fax:  [phone]  Name: Wayne Hernandez MRN: [id number] Date of Birth: 15-Jun-1970

## 2020-05-01 NOTE — Progress Notes (Signed)
  Subjective:  Patient ID: Eon Zunker, male    DOB: 1970/06/01,  MRN: [id number]  Chief Complaint  Patient presents with  . Post-op Problem    Follow up abscess.  "its doing great.  It stopped draining on Friday"    DOS: 12/31/2019 Procedure: Debridement Achilles tendon, resection heel spur posterior, Haglund's resection, gastrocnemius recession  50 y.o. male returns for post-op check.  Still taking antibiotic, thinks is doing better, stopped draining.  MRI was not authorized by his insurance  Review of Systems: Negative except as noted in the HPI. Denies N/V/F/Ch.   Objective:  There were no vitals filed for this visit. There is no height or weight on file to calculate BMI. Constitutional Well developed. Well nourished.  Vascular Foot warm and well perfused. Capillary refill normal to all digits.   Neurologic Normal speech. Oriented to person, place, and time. Epicritic sensation to light touch grossly present bilaterally.  Dermatologic  incisions have completely healed now.  Draining sinus has closed over  Orthopedic: Tenderness to palpation noted about the surgical site.   Radiographs: Right foot nonweightbearing films: Interval resection of posterior calcaneal enthesophyte and Haglund's deformity  Wound culture 12/22 with heavy growth of pansensitive Pseudomonas aeruginosa  New wound culture 04/12/2020 Pseudomonas aeruginosa pansensitive Assessment:   1. Postoperative wound abscess   2. Acquired Haglund's deformity of right heel   3. Gastrocnemius equinus of right lower extremity   4. 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 S/P foot surgery, right    Plan:  Patient was evaluated and treated and all questions answered.  S/p foot surgery right -Continue physical therapy as long as he is improving -Continue levofloxacin through next week -MRI reordered, his insurance wanted it to have contrast, I have talked with [facility] imaging about  rescheduling him   Return in 9 days (on 05/10/2020) for after MRI to review.

## 2020-05-02 ENCOUNTER — Ambulatory Visit
Admission: RE | Admit: 2020-05-02 | Discharge: 2020-05-02 | Disposition: A | Discharge status: Home / Self Care | Payer: 59 | Source: Ambulatory Visit | Attending: Podiatry | Admitting: Podiatry

## 2020-05-02 DIAGNOSIS — S91301A Unspecified open wound, right foot, initial encounter: Secondary | ICD-10-CM | POA: Diagnosis not present

## 2020-05-02 DIAGNOSIS — T8149XA Infection following a procedure, other surgical site, initial encounter: Secondary | ICD-10-CM

## 2020-05-02 MED ORDER — GADOBENATE DIMEGLUMINE 529 MG/ML IV SOLN
20.0000 mL | Freq: Once | INTRAVENOUS | Status: AC | PRN
  Administered 2020-05-02: 20 mL via INTRAVENOUS

## 2020-05-03 ENCOUNTER — Ambulatory Visit: Payer: 59

## 2020-05-03 ENCOUNTER — Other Ambulatory Visit: Payer: Self-pay

## 2020-05-03 DIAGNOSIS — M25571 Pain in right ankle and joints of right foot: Secondary | ICD-10-CM | POA: Diagnosis not present

## 2020-05-03 DIAGNOSIS — M25671 Stiffness of right ankle, not elsewhere classified: Secondary | ICD-10-CM | POA: Diagnosis not present

## 2020-05-03 DIAGNOSIS — R29898 Other symptoms and signs involving the musculoskeletal system: Secondary | ICD-10-CM

## 2020-05-03 NOTE — Therapy (Signed)
[place] [facility] [address] [place], [address] Phone: [phone]   Fax:  [phone]  Physical Therapy Treatment/ Progress Note  Reporting period: 03/21/2020 - 05/03/2020  Patient Details  Name: Yonatan Guitron MRN: [id number] Date of Birth: [birth year] No data recorded  Encounter Date: 05/03/2020   PT End of Session - 05/03/20 1125    Visit Number 13    Number of Visits 17    Date for PT Re-Evaluation 05/02/20    PT Start Time 1030    PT Stop Time 1115    PT Time Calculation (min) 45 min    Activity Tolerance Patient tolerated treatment well;No increased pain    Behavior During Therapy WFL for tasks assessed/performed           Past Medical History:  Diagnosis Date  . Diabetes ([place])   . Diverticulitis   . Elevated hemoglobin A1c   . Elevated uric acid in blood   . Esophagitis   . Hypertension   . Low HDL (under 40)   . Overweight     History reviewed. No pertinent surgical history.  There were no vitals filed for this visit.   Subjective Assessment - 05/03/20 1122    Subjective Patient states that he had his MRI yesterday and is waiting to find out what the Dr. decides to do with results. Patient reports that ankle is feeling good and is performing calf rasies throughout the day.    Pertinent History Surgery on Posterior R heel    Limitations House hold activities;Other (comment)    How long can you stand comfortably? NWB without boot, indefinitly with boot    How long can you walk comfortably? 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 No/denies             TREATMENT  Therapeutic Exercise  Bent knee calf raises 2 sets x15   Heel raises off of step 2 sets x10   Calf stretches with towel 2 sets x10   BAPS board- Level 5- 27mn Plantarflexion/dorsiflexion 20x CCW/CW  Knee to  wall soleus stretch/minor loading -x15  Single Leg Stance: 3x30 sec on airex pad  Squats performed on bosu ball -x 20 Without UE support  Total gym level 8 single leg calf raises 3 x 10  Performed exercises to address LE weakness        PT Education - 05/03/20 1124    Education Details form/technique with exercise    Person(s) Educated Patient    Methods Demonstration;Explanation    Comprehension Verbalized understanding;Returned demonstration            PT Short Term Goals - 05/03/20 1827      PT SHORT TERM GOAL #1   Title Patient will show independence with HEP    Baseline 03/21/20- Currently dependent; 05/03/2020: independent    Time 2    Period Weeks    Status Achieved    Target Date 04/04/20             PT Long Term Goals - 05/03/20 1820      PT LONG TERM GOAL #1   Title Patient will be able to ambulate 5065fwithout the use of CAM boot without increase in pain    Baseline 03/21/20- currently NWB; 05/03/2020: Walking not limited my pain at all  Time 6    Period Weeks    Status Achieved      PT LONG TERM GOAL #2   Title Patient will have full AROM in Right ankle to show increased strength and ROM    Baseline 03/21/20- Plantarflexion: Right- 27 degrees, Left- 40 degrees Dorsiflexion: R- 7 degrees from resting position, 0 degrees AROM from 90 degrees, L- 22 degrees from resting position, 8 degrees from 90  Inversion- R- 22 degrees, L-30 AROM, Eversion- R- 12 degrees AROM, L-22 degrees AROM; 05/03/2020: 20 dorsiflexion, 40 plantarflexion; Eversion: 20, Inversion 35    Time 6    Period Weeks    Status Achieved      PT LONG TERM GOAL #3   Title Patient will increase FOTO score to 74 to show increase in functional ability in  Right LE    Baseline 03/21/20- 56; 05/03/2020: 63    Time 6    Period Weeks    Status On-going                 Plan - 05/03/20 1132    Clinical Impression Statement Reassessment performed on today's visit. Patient has met walking  and AROM goals indicating functional improvement of the affected ankle. Although patient is improving, he continues to have increased difficulties with performing lifting and running which limit him from performing taks around his home. However, patient is improving overall with balance and stability while doing SLS exercise on airex pad. However, patient does still  demonstrate difficulty in engaging in single leg calf raises independently, indicating plantar flexor muscle weakness in affected LE. Patient will benefit from skilled therapy to improve strength and return to prior level of function.    Examination-Activity Limitations Bathing;Stairs;Stand;Squat;Transfers;Locomotion Level;Lift;Carry    Examination-Participation Restrictions Occupation;Yard Work;Cleaning;Community Activity    Stability/Clinical Decision Making Stable/Uncomplicated    Rehab Potential Good    PT Frequency 2x / week    PT Duration 6 weeks    PT Treatment/Interventions ADLs/Self Care Home Management;Cryotherapy;Electrical Stimulation;Moist Heat;Gait training;Functional mobility training;Therapeutic activities;Therapeutic exercise;Balance training;Neuromuscular re-education;Manual techniques;Scar mobilization;Passive range of motion;Dry needling;Joint Manipulations    PT Next Visit Plan Continued focous on strength to improve AROM    PT Home Exercise Plan 4-Way ankle with GTB, Towel Squeezes, Wt shifts    Consulted and Agree with Plan of Care Patient           Patient will benefit from skilled therapeutic intervention in order to improve the following deficits and impairments:  Abnormal gait,Decreased activity tolerance,Decreased endurance,Decreased mobility,Decreased range of motion,Difficulty walking,Hypomobility,Pain  Visit Diagnosis: Stiffness of right ankle, not elsewhere classified  Ankle weakness  Pain in right ankle and joints of right foot     Problem List Patient Active Problem List   Diagnosis Date  Noted  . Polycythemia, secondary 11/26/2016    Blythe Stanford, PT DPT 05/03/2020, 6:28 PM  [place] [facility] [address], [place], [address] Phone: [phone]   Fax:  [phone]  Name: Bird Swetz MRN: [id number] Date of Birth: 07-06-1970

## 2020-05-08 ENCOUNTER — Other Ambulatory Visit: Payer: Self-pay

## 2020-05-08 ENCOUNTER — Ambulatory Visit: Payer: 59

## 2020-05-08 DIAGNOSIS — M25671 Stiffness of right ankle, not elsewhere classified: Secondary | ICD-10-CM

## 2020-05-08 DIAGNOSIS — R29898 Other symptoms and signs involving the musculoskeletal system: Secondary | ICD-10-CM | POA: Diagnosis not present

## 2020-05-08 DIAGNOSIS — M25571 Pain in right ankle and joints of right foot: Secondary | ICD-10-CM

## 2020-05-08 NOTE — Addendum Note (Signed)
Addended by: Bethanie Dicker on: 05/08/2020 05:30 PM   Modules accepted: Orders

## 2020-05-08 NOTE — Therapy (Signed)
[place] [facility] [address] [place], [address] Phone: [phone]   Fax:  [phone]  Physical Therapy Treatment  Patient Details  Name: Wayne Hernandez MRN: [id number] Date of Birth: 1970/10/09 No data recorded  Encounter Date: 05/08/2020   PT End of Session - 05/08/20 1310    Visit Number 14    Number of Visits 17    Date for PT Re-Evaluation 05/02/20    PT Start Time 1025    PT Stop Time 1110    PT Time Calculation (min) 45 min    Activity Tolerance Patient tolerated treatment well;No increased pain    Behavior During Therapy WFL for tasks assessed/performed           Past Medical History:  Diagnosis Date  . Diabetes (HCC)   . Diverticulitis   . Elevated hemoglobin A1c   . Elevated uric acid in blood   . Esophagitis   . Hypertension   . Low HDL (under 40)   . Overweight     History reviewed. No pertinent surgical history.  There were no vitals filed for this visit.   Subjective Assessment - 05/08/20 1307    Subjective Patient reports that ankle is feeling good today. Patient states that he was able to ride his motorcycle and descend stairs this past weekend without an increase in pain.    Pertinent History Surgery on Posterior R heel    Limitations House hold activities;Other (comment)    How long can you stand comfortably? NWB without boot, indefinitly with boot    How long can you walk comfortably? 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, Glass blower/designer as a hobby           TREATMENT  Therapeutic Exercise  Bent knee calf raises 2 sets x15   Heel raises off of step 2 sets x10   Calf stretches with towel 2 sets x10   BAPS board- Level 5- Plantarflexion/dorsiflexion 20x CCW/CW  Knee to wall soleus stretch/minor loading - x15  Single Leg Stance: 3x30 sec on airex pad  Squats performed on bosu ball - 2  x 15  Without UE support  Total gym level 8 single leg calf raises 3 x 10  Manual Therapy - Grade 3  AP talocrural glide into DF - DF and PF stretching 3 x 15 sec holds    Performed exercises to address LE weakness     PT Education - 05/08/20 1310    Education Details form/technique with exercise    Person(s) Educated Patient    Methods Explanation;Demonstration    Comprehension Returned demonstration;Verbalized understanding            PT Short Term Goals - 05/03/20 1827      PT SHORT TERM GOAL #1   Title Patient will show independence with HEP    Baseline 03/21/20- Currently dependent; 05/03/2020: independent    Time 2    Period Weeks    Status Achieved    Target Date 04/04/20             PT Long Term Goals - 05/03/20 1820      PT LONG TERM GOAL #1   Title Patient will be able to ambulate 539ft without the use of CAM boot without increase in pain    Baseline 03/21/20- currently NWB; 05/03/2020: Walking not limited my pain at all  Time 6    Period Weeks    Status Achieved      PT LONG TERM GOAL #2   Title Patient will have full AROM in Right ankle to show increased strength and ROM    Baseline 03/21/20- Plantarflexion: Right- 27 degrees, Left- 40 degrees Dorsiflexion: R- 7 degrees from resting position, 0 degrees AROM from 90 degrees, L- 22 degrees from resting position, 8 degrees from 90  Inversion- R- 22 degrees, L-30 AROM, Eversion- R- 12 degrees AROM, L-22 degrees AROM; 05/03/2020: 20 dorsiflexion, 40 plantarflexion; Eversion: 20, Inversion 35    Time 6    Period Weeks    Status Achieved      PT LONG TERM GOAL #3   Title Patient will increase FOTO score to 74 to show increase in functional ability in  Right LE    Baseline 03/21/20- 56; 05/03/2020: 63    Time 6    Period Weeks    Status On-going                 Plan - 05/08/20 1311    Clinical Impression Statement Overall patient has increased ankle range of motion in plantar flexion/dorsiflexion and is able  to tolerate exercise such as squats on BOSU ball without an increase in symptom reproduction. Patient is still having difficulty with single leg calf raises on the Total Gym level 8, indicating some weakness in gastroc/soleus muscles. Patient will benefit from skilled physical therapy to improve strength and return to prior level or function.    Personal Factors and Comorbidities Past/Current Experience    Examination-Activity Limitations Bathing;Stairs;Stand;Squat;Transfers;Locomotion Level;Lift;Carry    Examination-Participation Restrictions Occupation;Yard Work;Cleaning;Community Activity    Stability/Clinical Decision Making Stable/Uncomplicated    Rehab Potential Good    PT Frequency 2x / week    PT Duration 6 weeks    PT Treatment/Interventions ADLs/Self Care Home Management;Cryotherapy;Electrical Stimulation;Moist Heat;Gait training;Functional mobility training;Therapeutic activities;Therapeutic exercise;Balance training;Neuromuscular re-education;Manual techniques;Scar mobilization;Passive range of motion;Dry needling;Joint Manipulations    PT Next Visit Plan Continued focous on strength to improve AROM    PT Home Exercise Plan 4-Way ankle with GTB, Towel Squeezes, Wt shifts    Consulted and Agree with Plan of Care Patient           Patient will benefit from skilled therapeutic intervention in order to improve the following deficits and impairments:  Abnormal gait,Decreased activity tolerance,Decreased endurance,Decreased mobility,Decreased range of motion,Difficulty walking,Hypomobility,Pain  Visit Diagnosis: Stiffness of right ankle, not elsewhere classified  Pain in right ankle and joints of right foot  Ankle weakness     Problem List Patient Active Problem List   Diagnosis Date Noted  . Polycythemia, secondary 11/26/2016    Marden Noble, SPT 05/08/2020, 1:23 PM  [place] [facility] PH[facility] SPORTS MEDICINE [address], [place], [address] Phone: [phone]   Fax:  [phone]  Name: Wayne Hernandez MRN: [id number] Date of Birth: 1970/08/29

## 2020-05-10 ENCOUNTER — Ambulatory Visit: Payer: 59

## 2020-05-10 ENCOUNTER — Other Ambulatory Visit: Payer: Self-pay

## 2020-05-10 ENCOUNTER — Encounter: Payer: Self-pay | Admitting: Podiatry

## 2020-05-10 ENCOUNTER — Ambulatory Visit (INDEPENDENT_AMBULATORY_CARE_PROVIDER_SITE_OTHER): Payer: 59 | Admitting: Podiatry

## 2020-05-10 DIAGNOSIS — M9261 Juvenile osteochondrosis of tarsus, right ankle: Secondary | ICD-10-CM | POA: Diagnosis not present

## 2020-05-10 DIAGNOSIS — R29898 Other symptoms and signs involving the musculoskeletal system: Secondary | ICD-10-CM

## 2020-05-10 DIAGNOSIS — M6528 Calcific tendinitis, other site: Secondary | ICD-10-CM

## 2020-05-10 DIAGNOSIS — T8149XA Infection following a procedure, other surgical site, initial encounter: Secondary | ICD-10-CM | POA: Diagnosis not present

## 2020-05-10 DIAGNOSIS — M216X1 Other acquired deformities of right foot: Secondary | ICD-10-CM

## 2020-05-10 DIAGNOSIS — M25571 Pain in right ankle and joints of right foot: Secondary | ICD-10-CM | POA: Diagnosis not present

## 2020-05-10 DIAGNOSIS — M25671 Stiffness of right ankle, not elsewhere classified: Secondary | ICD-10-CM

## 2020-05-10 DIAGNOSIS — M21861 Other specified acquired deformities of right lower leg: Secondary | ICD-10-CM

## 2020-05-10 MED ORDER — LEVOFLOXACIN 750 MG PO TABS
750.0000 mg | ORAL_TABLET | Freq: Every day | ORAL | 0 refills | Status: AC
Start: 2020-05-12 — End: 2020-05-26

## 2020-05-10 NOTE — Progress Notes (Signed)
Subjective:  Patient ID: Wayne Hernandez, male    DOB: 1970/06/01,  MRN: [id number]  Chief Complaint  Patient presents with  . Wound Check    "its not draining any more"  here to discuss MRI    DOS: 12/31/2019 Procedure: Debridement Achilles tendon, resection heel spur posterior, Haglund's resection, gastrocnemius recession  50 y.o. male returns for post-op check.  Completed the MRI, last dose of antibiotics is tomorrow  Review of Systems: Negative except as noted in the HPI. Denies N/V/F/Ch.   Objective:  There were no vitals filed for this visit. There is no height or weight on file to calculate BMI. Constitutional Well developed. Well nourished.  Vascular Foot warm and well perfused. Capillary refill normal to all digits.   Neurologic Normal speech. Oriented to person, place, and time. Epicritic sensation to light touch grossly present bilaterally.  Dermatologic  incisions have completely healed now.  Draining sinus has closed over  Orthopedic: Tenderness to palpation noted about the surgical site.   Radiographs: Right foot nonweightbearing films: Interval resection of posterior calcaneal enthesophyte and Haglund's deformity  Wound culture 12/22 with heavy growth of pansensitive Pseudomonas aeruginosa  New wound culture 04/12/2020 Pseudomonas aeruginosa pansensitive  Study Result  Narrative & Impression  CLINICAL DATA:  Nonhealing right heel wound. History of Achilles debridement and Haglund resection on 12/31/2019  EXAM: MR OF THE RIGHT HEEL WITHOUT AND WITH CONTRAST  TECHNIQUE: Multiplanar, multisequence MR imaging of the right heel was performed both before and after administration of intravenous contrast.  CONTRAST:  33mL MULTIHANCE GADOBENATE DIMEGLUMINE 529 MG/ML IV SOLN  COMPARISON:  12/12/2019.  FINDINGS: Bones/Joint/Cartilage  Interval Haglund resection of the posterior calcaneal tuberosity. Bone marrow edema and enhancement within the  posterosuperior calcaneus (series 11, images 11-14). There are subtle low T1 signal intensity changes adjacent to the tendon anchor sites posteriorly (series 8, image 20).  Focal subchondral marrow edema at the medial talar shoulder without definite overlying chondral defect. Remaining osseous structures of the ankle and midfoot are within normal limits. No acute fracture. No malalignment.  Ligaments  Intact medial and lateral ankle ligaments.  Muscles and Tendons  Thickened, heterogeneous appearance of the distal Achilles tendon likely representing a combination of postsurgical change and tendinosis. No tear. No peritendinous edema. No retrocalcaneal bursal fluid. No edema within Kager's fat.  Plantar fascia intact. Remaining tendinous structures about the ankle are intact. No tenosynovitis.  Soft tissues  Soft tissue edema at the posterior heel. No organized or rim enhancing fluid collection. Nonspecific circumferential subcutaneous edema of the distal aspect of the lower leg.  IMPRESSION: 1. Interval Haglund resection of the posterior calcaneal tuberosity. Bone marrow edema and enhancement within the posterosuperior calcaneus may reflect reactive osteitis given history of nonhealing wound. Subtle low T1 signal intensity changes within the posterior calcaneus at the tendon anchor sites may represent postsurgical change/granulation tissue. Early acute osteomyelitis would be difficult to exclude. 2. Thickened, heterogeneous appearance of the distal Achilles tendon likely representing a combination of postsurgical change and tendinosis. No tear. 3. Soft tissue edema at the posterior heel without organized or rim enhancing fluid collection. 4. Focal subchondral marrow edema at the medial talar shoulder without definite overlying chondral defect.   Electronically Signed   By: Duanne Guess D.O.   On: 05/02/2020 15:49     Assessment:   1.  Gastrocnemius equinus of right lower extremity   2. Postoperative wound abscess   3. 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 Acquired  Haglund's deformity of right heel    Plan:  Patient was evaluated and treated and all questions answered.  S/p foot surgery right -Continue physical therapy, he is going to once a week next week for the next month and then likely will be finished -Continue levofloxacin 2 additional weeks to complete 6-week course -MRI reviewed with patient no indication for surgical intervention at this point   Return in about 1 month (around 06/10/2020).

## 2020-05-10 NOTE — Therapy (Signed)
[place] [facility] [address] [place], [address] Phone: [phone]   Fax:  [phone]  Physical Therapy Treatment  Patient Details  Name: Wayne Hernandez MRN: [id number] Date of Birth: [birth year] No data recorded  Encounter Date: 05/10/2020   PT End of Session - 05/10/20 1204    Visit Number 15    Number of Visits 17    Date for PT Re-Evaluation 05/02/20    PT Start Time 1030    PT Stop Time 1115    PT Time Calculation (min) 45 min    Activity Tolerance Patient tolerated treatment well;No increased pain    Behavior During Therapy WFL for tasks assessed/performed           Past Medical History:  Diagnosis Date  . Diabetes (HCC)   . Diverticulitis   . Elevated hemoglobin A1c   . Elevated uric acid in blood   . Esophagitis   . Hypertension   . Low HDL (under 40)   . Overweight     History reviewed. No pertinent surgical history.  There were no vitals filed for this visit.   Subjective Assessment - 05/10/20 1029    Subjective Patient reports that he had an appointment with his MD today to follow up about his MRI, and he does not need additional surgery and was given more antibiotics for a few weeks. Patient states that ankle is feeling good today.    Pertinent History Surgery on Posterior R heel    Limitations House hold activities;Other (comment)    How long can you stand comfortably? NWB without boot, indefinitly with boot    How long can you walk comfortably? 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, Glass blower/designer as a hobby           TREATMENT  Therapeutic Exercise  Bent knee calf raises 2 sets x15   Heel raises off of 6" step 2 sets x15  Heel stretches off 6" step 5 x 30 sec hold   Calf stretches with towel 20   BAPS board- Level 5- Plantarflexion/dorsiflexion 20x CCW/CW  Knee to wall soleus stretch/minor loading  - x15  Single Leg Stance: 3x30 sec on airex pad  Squats performed on bosu ball - 2  x 15 Without UE support  Total gym level 8 single leg calf raises 3 x 10    Performed exercises to address LE weakness      PT Education - 05/10/20 1203    Education Details form/technique with exercise    Person(s) Educated Patient    Methods Explanation;Demonstration    Comprehension Verbalized understanding;Returned demonstration            PT Short Term Goals - 05/03/20 1827      PT SHORT TERM GOAL #1   Title Patient will show independence with HEP    Baseline 03/21/20- Currently dependent; 05/03/2020: independent    Time 2    Period Weeks    Status Achieved    Target Date 04/04/20             PT Long Term Goals - 05/03/20 1820      PT LONG TERM GOAL #1   Title Patient will be able to ambulate 568ft without the use of CAM boot without increase in pain    Baseline 03/21/20- currently NWB; 05/03/2020: Walking not limited my pain at all  Time 6    Period Weeks    Status Achieved      PT LONG TERM GOAL #2   Title Patient will have full AROM in Right ankle to show increased strength and ROM    Baseline 03/21/20- Plantarflexion: Right- 27 degrees, Left- 40 degrees Dorsiflexion: R- 7 degrees from resting position, 0 degrees AROM from 90 degrees, L- 22 degrees from resting position, 8 degrees from 90  Inversion- R- 22 degrees, L-30 AROM, Eversion- R- 12 degrees AROM, L-22 degrees AROM; 05/03/2020: 20 dorsiflexion, 40 plantarflexion; Eversion: 20, Inversion 35    Time 6    Period Weeks    Status Achieved      PT LONG TERM GOAL #3   Title Patient will increase FOTO score to 74 to show increase in functional ability in  Right LE    Baseline 03/21/20- 56; 05/03/2020: 63    Time 6    Period Weeks    Status On-going                 Plan - 05/10/20 1204    Clinical Impression Statement Patient continues to demonstrate an increase in ankle range of motion and is improving  balance/stability while doing SLS on airex pad. Patient still has difficulty engaging single leg calf raise secondary to gastroc weakness in affected LE. Patient would continue to benefit from skilled therapy to improve strength to return to prior level of function.    Personal Factors and Comorbidities Past/Current Experience    Examination-Activity Limitations Bathing;Stairs;Stand;Squat;Transfers;Locomotion Level;Lift;Carry    Examination-Participation Restrictions Occupation;Yard Work;Cleaning;Community Activity    Stability/Clinical Decision Making Stable/Uncomplicated    Rehab Potential Good    PT Frequency 2x / week    PT Duration 6 weeks    PT Treatment/Interventions ADLs/Self Care Home Management;Cryotherapy;Electrical Stimulation;Moist Heat;Gait training;Functional mobility training;Therapeutic activities;Therapeutic exercise;Balance training;Neuromuscular re-education;Manual techniques;Scar mobilization;Passive range of motion;Dry needling;Joint Manipulations    PT Next Visit Plan Continued focous on strength to improve AROM    PT Home Exercise Plan 4-Way ankle with GTB, Towel Squeezes, Wt shifts    Consulted and Agree with Plan of Care Patient           Patient will benefit from skilled therapeutic intervention in order to improve the following deficits and impairments:  Abnormal gait,Decreased activity tolerance,Decreased endurance,Decreased mobility,Decreased range of motion,Difficulty walking,Hypomobility,Pain  Visit Diagnosis: Stiffness of right ankle, not elsewhere classified  Pain in right ankle and joints of right foot  Ankle weakness     Problem List Patient Active Problem List   Diagnosis Date Noted  . Polycythemia, secondary 11/26/2016    Marden Noble, SPT 05/10/2020, 12:12 PM  [place] [facility] PHYSICAL AND SPORTS MEDICINE [address], [place], [address] Phone: [phone]   Fax:  [phone]  Name: Wayne Hernandez MRN: [id number] Date of Birth: 09-28-1970

## 2020-05-16 ENCOUNTER — Ambulatory Visit: Payer: 59 | Attending: Podiatry

## 2020-05-16 ENCOUNTER — Other Ambulatory Visit: Payer: Self-pay

## 2020-05-16 DIAGNOSIS — M25671 Stiffness of right ankle, not elsewhere classified: Secondary | ICD-10-CM | POA: Diagnosis not present

## 2020-05-16 DIAGNOSIS — R29898 Other symptoms and signs involving the musculoskeletal system: Secondary | ICD-10-CM | POA: Diagnosis not present

## 2020-05-16 NOTE — Therapy (Signed)
[place] [facility]ORTS MEDIC[facility] [address], [place], [address] Phone: [phone]   Fax:  [phone]  Physical Therapy Treatment/Discharge Summary  Patient Details  Name: Wayne Hernandez MRN: [id number] Date of Birth: 30-Nov-1970 No data recorded  Encounter Date: 05/16/2020   PT End of Session - 05/16/20 1400    Visit Number 16    Number of Visits 17    Date for PT Re-Evaluation 07/29/20    PT Start Time 1030    PT Stop Time 1115    PT Time Calculation (min) 45 min    Activity Tolerance Patient tolerated treatment well;No increased pain    Behavior During Therapy WFL for tasks assessed/performed           Past Medical History:  Diagnosis Date  . Diabetes ([place])   . Diverticulitis   . Elevated hemoglobin A1c   . Elevated uric acid in blood   . Esophagitis   . Hypertension   . Low HDL (under 40)   . Overweight     History reviewed. No pertinent surgical history.  There were no vitals filed for this visit.   Subjective Assessment - 05/16/20 1400    Subjective Patient states that his ankle feels great today. Patient reports that yesterday was the best he has felt since prior to his surgery.    Pertinent History Surgery on Posterior R heel    Limitations House hold activities;Other (comment)    How long can you stand comfortably? NWB without boot, indefinitly with boot    How long can you walk comfortably? 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, Neurosurgeon as a hobby            TREATMENT  Therapeutic Exercise Single leg calf raises w/ UE support 2 x 10   Heel raises off of 6" step 2 sets x15 Heel stretches off 6" step 5 x 30 sec hold  Calf stretches with towel 20  BAPS board- Level 5- 42mn Plantarflexion/dorsiflexion 20x CCW/CW Knee to wall soleus stretch/minor loading - x15 Single Leg Stance: 3x30 sec on airex pad Squats performed on bosu  ball - 2 x 15 Without UE support Total gym level 8 single leg calf raises 3 x 10    Performed exercises to address LE weakness     PT Education - 05/16/20 1402    Education Details form/technique with exercise    Person(s) Educated Patient    Methods Demonstration;Explanation    Comprehension Verbalized understanding;Returned demonstration            PT Short Term Goals - 05/03/20 1827      PT SHORT TERM GOAL #1   Title Patient will show independence with HEP    Baseline 03/21/20- Currently dependent; 05/03/2020: independent    Time 2    Period Weeks    Status Achieved    Target Date 04/04/20             PT Long Term Goals - 05/16/20 1358      PT LONG TERM GOAL #1   Title Patient will be able to ambulate 5075fwithout the use of CAM boot without increase in pain    Baseline 03/21/20- currently NWB; 05/03/2020: Walking not limited my pain at all    Time 6    Period Weeks    Status Achieved      PT LONG TERM GOAL #2  Title Patient will have full AROM in Right ankle to show increased strength and ROM    Baseline 03/21/20- Plantarflexion: Right- 27 degrees, Left- 40 degrees Dorsiflexion: R- 7 degrees from resting position, 0 degrees AROM from 90 degrees, L- 22 degrees from resting position, 8 degrees from 90  Inversion- R- 22 degrees, L-30 AROM, Eversion- R- 12 degrees AROM, L-22 degrees AROM; 05/03/2020: 20 dorsiflexion, 40 plantarflexion; Eversion: 20, Inversion 35    Time 6    Period Weeks    Status Achieved      PT LONG TERM GOAL #3   Title Patient will increase FOTO score to 74 to show increase in functional ability in  Right LE    Baseline 03/21/20- 56; 05/03/2020: 63,05/16/20: 77    Time 6    Period Weeks    Status Achieved                 Plan - 05/16/20 1355    Clinical Impression Statement Overall, patient demonstrates improvement with ankle dorsiflexion/plantarflexion and inversion/eversion range of motion. Patient is able to ambulate without an increase in  pain. Patient is able to stand for prolonged periods of time without pain to perform work related tasks. Patient had a significant improvement in FOTO score. Patient has met all long term goals and is to be discharged from physical therapy.    Personal Factors and Comorbidities Past/Current Experience    Examination-Activity Limitations Bathing;Stairs;Stand;Squat;Transfers;Locomotion Level;Lift;Carry    Examination-Participation Restrictions Occupation;Yard Work;Cleaning;Community Activity    Stability/Clinical Decision Making Stable/Uncomplicated    Rehab Potential Good    PT Frequency 2x / week    PT Duration 6 weeks    PT Treatment/Interventions ADLs/Self Care Home Management;Cryotherapy;Electrical Stimulation;Moist Heat;Gait training;Functional mobility training;Therapeutic activities;Therapeutic exercise;Balance training;Neuromuscular re-education;Manual techniques;Scar mobilization;Passive range of motion;Dry needling;Joint Manipulations    PT Next Visit Plan Continued focous on strength to improve AROM    PT Home Exercise Plan 4-Way ankle with GTB, Towel Squeezes, Wt shifts    Consulted and Agree with Plan of Care Patient           Patient will benefit from skilled therapeutic intervention in order to improve the following deficits and impairments:  Abnormal gait,Decreased activity tolerance,Decreased endurance,Decreased mobility,Decreased range of motion,Difficulty walking,Hypomobility,Pain  Visit Diagnosis: Stiffness of right ankle, not elsewhere classified  Ankle weakness     Problem List Patient Active Problem List   Diagnosis Date Noted  . Polycythemia, secondary 11/26/2016    Jaynie Crumble, SPT 05/17/2020, 9:57 AM  [place] [facility] [address] [place], [address] Phone: [phone]   Fax:  [phone]  Name: Wayne Hernandez MRN: [id number] Date of Birth: [birth year]

## 2020-05-23 ENCOUNTER — Ambulatory Visit: Payer: 59

## 2020-06-12 ENCOUNTER — Ambulatory Visit (INDEPENDENT_AMBULATORY_CARE_PROVIDER_SITE_OTHER): Payer: 59 | Admitting: Podiatry

## 2020-06-12 ENCOUNTER — Other Ambulatory Visit: Payer: Self-pay

## 2020-06-12 ENCOUNTER — Encounter: Payer: Self-pay | Admitting: Podiatry

## 2020-06-12 DIAGNOSIS — M216X1 Other acquired deformities of right foot: Secondary | ICD-10-CM

## 2020-06-12 DIAGNOSIS — M21861 Other specified acquired deformities of right lower leg: Secondary | ICD-10-CM

## 2020-06-12 DIAGNOSIS — M9261 Juvenile osteochondrosis of tarsus, right ankle: Secondary | ICD-10-CM

## 2020-06-12 DIAGNOSIS — M6528 Calcific tendinitis, other site: Secondary | ICD-10-CM | POA: Diagnosis not present

## 2020-06-12 NOTE — Progress Notes (Signed)
Subjective:  Patient ID: Wayne Hernandez, male    DOB: 06/26/1970,  MRN: [id number]  Chief Complaint  Patient presents with  . Routine Post Op    "its doing great"    DOS: 12/31/2019 Procedure: Debridement Achilles tendon, resection heel spur posterior, Haglund's resection, gastrocnemius recession  50 y.o. male returns for post-op check.  Doing great he has completed his therapy and feels like he is at 100%  Review of Systems: Negative except as noted in the HPI. Denies N/V/F/Ch.   Objective:  There were no vitals filed for this visit. There is no height or weight on file to calculate BMI. Constitutional Well developed. Well nourished.  Vascular Foot warm and well perfused. Capillary refill normal to all digits.   Neurologic Normal speech. Oriented to person, place, and time. Epicritic sensation to light touch grossly present bilaterally.  Dermatologic  incisions well-healed  Orthopedic:  No tenderness to palpation noted about the surgical site.  5 out of 5 strength.   Radiographs: Right foot nonweightbearing films: Interval resection of posterior calcaneal enthesophyte and Haglund's deformity  Wound culture 12/22 with heavy growth of pansensitive Pseudomonas aeruginosa  New wound culture 04/12/2020 Pseudomonas aeruginosa pansensitive  Study Result  Narrative & Impression  CLINICAL DATA:  Nonhealing right heel wound. History of Achilles debridement and Haglund resection on 12/31/2019  EXAM: MR OF THE RIGHT HEEL WITHOUT AND WITH CONTRAST  TECHNIQUE: Multiplanar, multisequence MR imaging of the right heel was performed both before and after administration of intravenous contrast.  CONTRAST:  74mL MULTIHANCE GADOBENATE DIMEGLUMINE 529 MG/ML IV SOLN  COMPARISON:  12/12/2019.  FINDINGS: Bones/Joint/Cartilage  Interval Haglund resection of the posterior calcaneal tuberosity. Bone marrow edema and enhancement within the posterosuperior calcaneus (series 11,  images 11-14). There are subtle low T1 signal intensity changes adjacent to the tendon anchor sites posteriorly (series 8, image 20).  Focal subchondral marrow edema at the medial talar shoulder without definite overlying chondral defect. Remaining osseous structures of the ankle and midfoot are within normal limits. No acute fracture. No malalignment.  Ligaments  Intact medial and lateral ankle ligaments.  Muscles and Tendons  Thickened, heterogeneous appearance of the distal Achilles tendon likely representing a combination of postsurgical change and tendinosis. No tear. No peritendinous edema. No retrocalcaneal bursal fluid. No edema within Kager's fat.  Plantar fascia intact. Remaining tendinous structures about the ankle are intact. No tenosynovitis.  Soft tissues  Soft tissue edema at the posterior heel. No organized or rim enhancing fluid collection. Nonspecific circumferential subcutaneous edema of the distal aspect of the lower leg.  IMPRESSION: 1. Interval Haglund resection of the posterior calcaneal tuberosity. Bone marrow edema and enhancement within the posterosuperior calcaneus may reflect reactive osteitis given history of nonhealing wound. Subtle low T1 signal intensity changes within the posterior calcaneus at the tendon anchor sites may represent postsurgical change/granulation tissue. Early acute osteomyelitis would be difficult to exclude. 2. Thickened, heterogeneous appearance of the distal Achilles tendon likely representing a combination of postsurgical change and tendinosis. No tear. 3. Soft tissue edema at the posterior heel without organized or rim enhancing fluid collection. 4. Focal subchondral marrow edema at the medial talar shoulder without definite overlying chondral defect.   Electronically Signed   By: Duanne Guess D.O.   On: 05/02/2020 15:49     Assessment:   1. Gastrocnemius equinus of right lower extremity    2. 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 Acquired Haglund's deformity of right heel  Plan:  Patient was evaluated and treated and all questions answered.  S/p foot surgery right -He is doing quite well he is healed at this point.  He may resume his regular activities.  Return as needed   Return if symptoms worsen or fail to improve.

## 2020-06-23 NOTE — Progress Notes (Signed)
Physical 03/30/20 with Dr. Pricilla Handler - requested Bethann Berkshire return in 3 months for A1c.  AMD

## 2020-06-27 ENCOUNTER — Other Ambulatory Visit: Payer: Self-pay

## 2020-06-27 ENCOUNTER — Ambulatory Visit: Payer: Self-pay

## 2020-06-27 DIAGNOSIS — R7309 Other abnormal glucose: Secondary | ICD-10-CM

## 2020-06-27 NOTE — Progress Notes (Signed)
   Subjective:    Patient ID: Wayne Hernandez, male    DOB: 1970/04/13, 50 y.o.   MRN: [id number]  HPI 50 year old male presenting for A1C follow up no complaints taking Metformin 500 BID.  See my Dr. Fran Lowes 3 months ago.        Objective:  No acute distress here for lab draw         Assessment & Plan:  Will follow up with A1C results when available RTC as needed

## 2020-06-28 ENCOUNTER — Encounter: Payer: Self-pay | Admitting: Nurse Practitioner

## 2020-06-28 LAB — HGB A1C W/O EAG: Hgb A1c MFr Bld: 6.5 % — ABNORMAL HIGH (ref 4.8–5.6)

## 2020-08-09 DIAGNOSIS — R102 Pelvic and perineal pain: Secondary | ICD-10-CM | POA: Diagnosis not present

## 2020-08-09 DIAGNOSIS — R350 Frequency of micturition: Secondary | ICD-10-CM | POA: Diagnosis not present

## 2020-08-09 DIAGNOSIS — R319 Hematuria, unspecified: Secondary | ICD-10-CM | POA: Diagnosis not present

## 2020-08-09 DIAGNOSIS — R109 Unspecified abdominal pain: Secondary | ICD-10-CM | POA: Diagnosis not present

## 2020-08-09 DIAGNOSIS — R399 Unspecified symptoms and signs involving the genitourinary system: Secondary | ICD-10-CM | POA: Diagnosis not present

## 2020-08-28 ENCOUNTER — Other Ambulatory Visit: Payer: Self-pay

## 2020-08-28 DIAGNOSIS — Z09 Encounter for follow-up examination after completed treatment for conditions other than malignant neoplasm: Secondary | ICD-10-CM

## 2020-08-28 LAB — POCT URINALYSIS DIPSTICK
Bilirubin, UA: NEGATIVE
Blood, UA: NEGATIVE
Glucose, UA: NEGATIVE
Ketones, UA: NEGATIVE
Leukocytes, UA: NEGATIVE
Nitrite, UA: NEGATIVE
Protein, UA: POSITIVE — AB
Spec Grav, UA: >=1.03 — AB (ref 1.010–1.025)
Urobilinogen, UA: 0.2 E.U./dL
pH, UA: 5.5 (ref 5.0–8.0)

## 2020-09-14 ENCOUNTER — Other Ambulatory Visit: Payer: Self-pay

## 2020-09-14 DIAGNOSIS — E1169 Type 2 diabetes mellitus with other specified complication: Secondary | ICD-10-CM

## 2020-09-14 NOTE — Progress Notes (Signed)
Scheduled to review results 09/19/20.  AMD

## 2020-09-19 ENCOUNTER — Other Ambulatory Visit: Payer: Self-pay

## 2020-09-19 ENCOUNTER — Encounter: Payer: Self-pay | Admitting: Physician Assistant

## 2020-09-19 ENCOUNTER — Ambulatory Visit: Payer: Self-pay | Admitting: Physician Assistant

## 2020-09-19 VITALS — BP 124/95 | HR 97 | Temp 97.6°F | Resp 14 | Ht 70.0 in | Wt 265.0 lb

## 2020-09-19 DIAGNOSIS — E0800 Diabetes mellitus due to underlying condition with hyperosmolarity without nonketotic hyperglycemic-hyperosmolar coma (NKHHC): Secondary | ICD-10-CM

## 2020-09-19 MED ORDER — METFORMIN HCL 1000 MG PO TABS: 1000.0000 mg | ORAL_TABLET | Freq: Two times a day (BID) | ORAL | 3 refills | Status: DC

## 2020-09-19 NOTE — Progress Notes (Signed)
Pt denies any issues or concerns at this time/CL,RMA ?

## 2020-09-19 NOTE — Progress Notes (Signed)
   Subjective: Follow-up UTI and diabetes.    Patient ID: Wayne Hernandez, male    DOB: [birth year], 50 y.o.   MRN: [id number]  HPI Patient returns for reevaluation of UTI.  Patient was supposed to come and 2 weeks ago to discuss follow-up urinalysis.  Patient also requests refill of metformin.   Review of Systems Diabetes, hypertension,    Objective:   Physical Exam  No acute distress.  Temperature 97.6, pulse 97, respiration 14, BP is 124/95, patient 96% O2 sat on room air.  Patient weighs 265 pounds BMI is 38. HEENT is unremarkable.  Lungs clear to auscultation.  Heart regular rate and rhythm.      Assessment & Plan: Diabetes   Reviewed patient recent urinalysis without signs of urinary tract infection.  Patient is currently taking metformin 500 mg twice daily.  Will increase to 1000 mg twice daily.  Patient follow-up in 6 months.

## 2020-09-20 LAB — HGB A1C W/O EAG: Hgb A1c MFr Bld: 6.6 % — ABNORMAL HIGH (ref 4.8–5.6)

## 2020-09-20 LAB — SPECIMEN STATUS REPORT

## 2020-09-21 ENCOUNTER — Other Ambulatory Visit: Payer: Self-pay | Admitting: Emergency Medicine

## 2020-09-21 DIAGNOSIS — I10 Essential (primary) hypertension: Secondary | ICD-10-CM

## 2020-09-22 LAB — BUN+CREAT
BUN/Creatinine Ratio: 13 (ref 9–20)
BUN: 12 mg/dL (ref 6–24)
Creatinine, Ser: 0.93 mg/dL (ref 0.76–1.27)
eGFR: 100 mL/min/{1.73_m2} (ref >59)

## 2021-04-09 ENCOUNTER — Ambulatory Visit: Payer: Self-pay

## 2021-04-09 ENCOUNTER — Other Ambulatory Visit: Payer: Self-pay

## 2021-04-09 DIAGNOSIS — Z0000 Encounter for general adult medical examination without abnormal findings: Secondary | ICD-10-CM

## 2021-04-09 DIAGNOSIS — R7309 Other abnormal glucose: Secondary | ICD-10-CM

## 2021-04-09 LAB — POCT URINALYSIS DIPSTICK
Bilirubin, UA: NEGATIVE
Blood, UA: NEGATIVE
Glucose, UA: NEGATIVE
Ketones, UA: NEGATIVE
Nitrite, UA: NEGATIVE
Protein, UA: NEGATIVE
Spec Grav, UA: 1.01 (ref 1.010–1.025)
Urobilinogen, UA: 0.2 E.U./dL
pH, UA: 6 (ref 5.0–8.0)

## 2021-04-09 NOTE — Progress Notes (Signed)
04/17/21 annual physical scheduled.

## 2021-04-10 LAB — CMP12+LP+TP+TSH+6AC+PSA+CBC…
ALT: 17 IU/L (ref 0–44)
AST: 22 IU/L (ref 0–40)
Albumin/Globulin Ratio: 1.5 (ref 1.2–2.2)
Albumin: 4.7 g/dL (ref 3.8–4.9)
Alkaline Phosphatase: 109 IU/L (ref 44–121)
BUN/Creatinine Ratio: 11 (ref 9–20)
BUN: 11 mg/dL (ref 6–24)
Basophils Absolute: 0.1 10*3/uL (ref 0.0–0.2)
Basos: 1 %
Bilirubin Total: 0.5 mg/dL (ref 0.0–1.2)
Calcium: 9.3 mg/dL (ref 8.7–10.2)
Chloride: 95 mmol/L — ABNORMAL LOW (ref 96–106)
Chol/HDL Ratio: 5.6 ratio — ABNORMAL HIGH (ref 0.0–5.0)
Cholesterol, Total: 234 mg/dL — ABNORMAL HIGH (ref 100–199)
Creatinine, Ser: 0.97 mg/dL (ref 0.76–1.27)
EOS (ABSOLUTE): 0.3 10*3/uL (ref 0.0–0.4)
Eos: 3 %
Estimated CHD Risk: 1.2 times avg. — ABNORMAL HIGH (ref 0.0–1.0)
Free Thyroxine Index: 2.3 (ref 1.2–4.9)
GGT: 24 IU/L (ref 0–65)
Globulin, Total: 3.1 g/dL (ref 1.5–4.5)
Glucose: 117 mg/dL — ABNORMAL HIGH (ref 70–99)
HDL: 42 mg/dL (ref >39)
Hematocrit: 55.3 % — ABNORMAL HIGH (ref 37.5–51.0)
Hemoglobin: 18.6 g/dL — ABNORMAL HIGH (ref 13.0–17.7)
Immature Grans (Abs): 0 10*3/uL (ref 0.0–0.1)
Immature Granulocytes: 0 %
Iron: 94 ug/dL (ref 38–169)
LDH: 168 IU/L (ref 121–224)
LDL Chol Calc (NIH): 167 mg/dL — ABNORMAL HIGH (ref 0–99)
Lymphocytes Absolute: 1.4 10*3/uL (ref 0.7–3.1)
Lymphs: 13 %
MCH: 27.6 pg (ref 26.6–33.0)
MCHC: 33.6 g/dL (ref 31.5–35.7)
MCV: 82 fL (ref 79–97)
Monocytes Absolute: 0.6 10*3/uL (ref 0.1–0.9)
Monocytes: 6 %
Neutrophils Absolute: 8.6 10*3/uL — ABNORMAL HIGH (ref 1.4–7.0)
Neutrophils: 77 %
Phosphorus: 2.4 mg/dL — ABNORMAL LOW (ref 2.8–4.1)
Platelets: 345 10*3/uL (ref 150–450)
Potassium: 4.4 mmol/L (ref 3.5–5.2)
Prostate Specific Ag, Serum: 0.5 ng/mL (ref 0.0–4.0)
RBC: 6.73 x10E6/uL — ABNORMAL HIGH (ref 4.14–5.80)
RDW: 13.3 % (ref 11.6–15.4)
Sodium: 135 mmol/L (ref 134–144)
T3 Uptake Ratio: 23 % — ABNORMAL LOW (ref 24–39)
T4, Total: 10 ug/dL (ref 4.5–12.0)
TSH: 0.908 u[IU]/mL (ref 0.450–4.500)
Total Protein: 7.8 g/dL (ref 6.0–8.5)
Triglycerides: 136 mg/dL (ref 0–149)
Uric Acid: 7.1 mg/dL (ref 3.8–8.4)
VLDL Cholesterol Cal: 25 mg/dL (ref 5–40)
WBC: 11.1 10*3/uL — ABNORMAL HIGH (ref 3.4–10.8)
eGFR: 95 mL/min/{1.73_m2} (ref >59)

## 2021-04-13 ENCOUNTER — Other Ambulatory Visit: Payer: Self-pay

## 2021-04-13 ENCOUNTER — Ambulatory Visit: Payer: Self-pay | Admitting: Physician Assistant

## 2021-04-13 ENCOUNTER — Encounter: Payer: Self-pay | Admitting: Physician Assistant

## 2021-04-13 VITALS — BP 123/83 | HR 106 | Temp 97.1°F | Resp 16 | Ht 70.0 in | Wt 257.0 lb

## 2021-04-13 DIAGNOSIS — Z0000 Encounter for general adult medical examination without abnormal findings: Secondary | ICD-10-CM

## 2021-04-13 NOTE — Progress Notes (Signed)
[place] [facility]   ____________________________________________   None    (approximate)  I have reviewed the triage vital signs and the nursing notes.   HISTORY  Chief Complaint Annual Exam   HPI Wayne Hernandez is a 51 y.o. male patient presents for annual physical exam.  Patient reports no concerns or complaints.         Past Medical History:  Diagnosis Date   Diabetes ([place])    Diverticulitis    Elevated hemoglobin A1c    Elevated uric acid in blood    Esophagitis    Hypertension    Low HDL (under 40)    Overweight     Patient Active Problem List   Diagnosis Date Noted   Polycythemia, secondary 11/26/2016    Past Surgical History:  Procedure Laterality Date   FOOT SURGERY Right 12/31/2019    Prior to Admission medications   Medication Sig Start Date End Date Taking? Authorizing Provider  aspirin EC 81 MG tablet Take 81 mg by mouth daily.   Yes [provider]  lisinopril (ZESTRIL) 10 MG tablet Take 1 tablet by mouth once daily 09/21/20  Yes Dewell, Monnier, PA-C  metFORMIN (GLUCOPHAGE) 1000 MG tablet Take 1 tablet (1,000 mg total) by mouth 2 (two) times daily with a meal. 09/19/20  Yes Sable Feil, PA-C  ibuprofen (ADVIL,MOTRIN) 200 MG tablet Take 600 mg by mouth as needed.     [provider]    Allergies Patient has no known allergies.  Family History  Problem Relation Age of Onset   Liver disease Mother        27 yrs S/P transplant   Colon cancer Paternal Grandfather     Social History Social History   Tobacco Use   Smoking status: Never   Smokeless tobacco: Never  Vaping Use   Vaping Use: Never used  Substance Use Topics   Alcohol use: Yes    Comment: occasional    Review of Systems  Constitutional: No fever/chills Eyes: No visual changes. ENT: No sore throat. Cardiovascular: Denies chest pain. Respiratory: Denies shortness of breath. Gastrointestinal: No abdominal pain.  No nausea, no vomiting.   No diarrhea.  No constipation. Genitourinary: Negative for dysuria. Musculoskeletal: Negative for back pain. Skin: Negative for rash. Neurological: Negative for headaches, focal weakness or numbness. Endocrine: Diabetes and hypertension Hematological/Lymphatic: Polycythemia   ____________________________________________   PHYSICAL EXAM:  VITAL SIGNS: Constitutional: Alert and oriented. Well appearing and in no acute distress. Eyes: Conjunctivae are normal. PERRL. EOMI. Head: Atraumatic. Nose: No congestion/rhinnorhea. Mouth/Throat: Mucous membranes are moist.  Oropharynx non-erythematous. Neck: No stridor.  No cervical spine tenderness to palpation. Hematological/Lymphatic/Immunilogical: No cervical lymphadenopathy. Cardiovascular: Normal rate, regular rhythm. Grossly normal heart sounds.  Good peripheral circulation. Respiratory: Normal respiratory effort.  No retractions. Lungs CTAB. Gastrointestinal: Soft and nontender.  Distention secondary to body habitus.. No abdominal bruits. No CVA tenderness. Genitourinary: Deferred Musculoskeletal: No lower extremity tenderness nor edema.  No joint effusions. Neurologic:  Normal speech and language. No gross focal neurologic deficits are appreciated. No gait instability. Skin:  Skin is warm, dry and intact. No rash noted. Psychiatric: Mood and affect are normal. Speech and behavior are normal.  ____________________________________________   LABS ____ ______        Component Ref Range & Units 4 d ago 7 mo ago 1 yr ago 6 yr ago  Color, UA  yellow  yellow  Dark Yellow    Clarity, UA  clear  clear  Clear    Glucose, UA Negative Negative  Negative  Negative    Bilirubin, UA  negative  negative  Negative    Ketones, UA  negative  negative  Negative    Spec Grav, UA 1.010 - 1.025 1.010  >=1.030 Abnormal   1.020    Blood, UA  negative  negative  Negative    pH, UA 5.0 - 8.0 6.0  5.5  6.0    Protein, UA Negative Negative  Positive  Abnormal  CM  Negative    Urobilinogen, UA 0.2 or 1.0 E.U./dL 0.2  0.2  0.2    Nitrite, UA  negative  negative  Negative    Leukocytes, UA Negative Trace Abnormal   Negative  Moderate (2+) Abnormal   TRACE Abnormal  R   Appearance   dark    CLEAR Abnormal  R   Odor         Resulting Agency     [facility]         Specimen Collected: 04/09/21 09:33 Last Resulted: 04/09/21 09:33      Lab Flowsheet    Order Details    View Encounter    Lab and Collection Details    Routing    Result History    View Encounter Conversation      CM=Additional comments  R=Reference range differs from displayed range      Result Care Coordination   Patient Communication   Add Comments   Seen Back to Top       Other Results from 04/09/2021   Contains abnormal data CMP12+LP+TP+TSH+6AC+PSA+CBC Order: [id number] Status: Final result    Visible to patient: Yes (seen)    Next appt: 10/17/2021 at 08:15 AM in [facility] ([facility])    Dx: Routine adult health maintenance    0 Result Notes           Component Ref Range & Units 4 d ago (04/09/21) 7 mo ago (09/14/20) 1 yr ago (03/23/20) 1 yr ago (12/17/19) 1 yr ago (06/08/19) 3 yr ago (06/17/17) 4 yr ago (12/17/16)  Glucose 70 - 99 mg/dL 117 High    121 High  R   150 High  R     Uric Acid 3.8 - 8.4 mg/dL 7.1   8.0 CM   8.2 CM     Comment:            Therapeutic target for gout patients: <6.0  BUN 6 - 24 mg/dL $Remove'11  12  11   9     'LcZCrsU$ Creatinine, Ser 0.76 - 1.27 mg/dL 0.97  0.93  0.92   1.02     eGFR >59 mL/min/1.73 95  100        BUN/Creatinine Ratio 9 - $R'20 11  13  12   9     'wL$ Sodium 134 - 144 mmol/L 135   135   137     Potassium 3.5 - 5.2 mmol/L 4.4   4.5   4.7     Chloride 96 - 106 mmol/L 95 Low    98   99     Calcium 8.7 - 10.2 mg/dL 9.3   8.8   9.1     Phosphorus 2.8 - 4.1 mg/dL 2.4 Low    2.8   2.3 Low      Total Protein 6.0 - 8.5 g/dL 7.8   7.1   7.3     Albumin 3.8 - 4.9 g/dL 4.7   4.1 R  4.0 R     Globulin, Total 1.5 - 4.5 g/dL 3.1   3.0   3.3      Albumin/Globulin Ratio 1.2 - 2.2 1.5   1.4   1.2     Bilirubin Total 0.0 - 1.2 mg/dL 0.5   0.3   0.5     Alkaline Phosphatase 44 - 121 IU/L 109   113   119 High  R     LDH 121 - 224 IU/L 168   151   161     AST 0 - 40 IU/L $Remov'22   26   20     'bTvdRE$ ALT 0 - 44 IU/L $Remov'17   21   18     'Wsejsg$ GGT 0 - 65 IU/L 24   32   26     Iron 38 - 169 ug/dL 94   103   86     Cholesterol, Total 100 - 199 mg/dL 234 High    200 High   209 High   202 High      Triglycerides 0 - 149 mg/dL 136   124  135  117     HDL >39 mg/dL 42   40  37 Low   38 Low      VLDL Cholesterol Cal 5 - 40 mg/dL $Remove'25   22  25  21     'pYjSSNJ$ LDL Chol Calc (NIH) 0 - 99 mg/dL 167 High    138 High   147 High   143 High      Chol/HDL Ratio 0.0 - 5.0 ratio 5.6 High    5.0 CM  5.6 High  CM  5.3 High  CM     Comment:                                   T. Chol/HDL Ratio                                              Men  Women                                1/2 Avg.Risk  3.4    3.3                                    Avg.Risk  5.0    4.4                                 2X Avg.Risk  9.6    7.1                                 3X Avg.Risk 23.4   11.0   Estimated CHD Risk 0.0 - 1.0 times avg. 1.2 High    1.0 CM   1.1 High  CM     Comment: The CHD Risk is based on the T. Chol/HDL ratio. Other  factors affect CHD Risk such as hypertension, smoking,  diabetes, severe obesity, and family history of  premature CHD.   TSH  0.450 - 4.500 uIU/mL 0.908   0.816   0.715     T4, Total 4.5 - 12.0 ug/dL 10.0   9.1   8.8     T3 Uptake Ratio 24 - 39 % 23 Low    26   23 Low      Free Thyroxine Index 1.2 - 4.9 2.3   2.4   2.0     Prostate Specific Ag, Serum 0.0 - 4.0 ng/mL 0.5   0.3 CM   0.4 CM     Comment: Roche ECLIA methodology.  According to the American Urological Association, Serum PSA should  decrease and remain at undetectable levels after radical  prostatectomy. The AUA defines biochemical recurrence as an initial  PSA value 0.2 ng/mL or greater followed by a subsequent  confirmatory  PSA value 0.2 ng/mL or greater.  Values obtained with different assay methods or kits cannot be used  interchangeably. Results cannot be interpreted as absolute evidence  of the presence or absence of malignant disease.   WBC 3.4 - 10.8 x10E3/uL 11.1 High    8.6   10.5  11.8 High  R  11.2 High  R   RBC 4.14 - 5.80 x10E6/uL 6.73 High    6.18 High    6.04 High   6.10 High  R  6.08 High  R   Hemoglobin 13.0 - 17.7 g/dL 18.6 High    17.1   17.3  17.7 R  17.3 R   Hematocrit 37.5 - 51.0 % 55.3 High    51.4 High    50.8  50.9 R  51.0 R   MCV 79 - 97 fL 82   83   84  83.4 R  83.9 R   MCH 26.6 - 33.0 pg 27.6   27.7   28.6  29.1 R  28.5 R   MCHC 31.5 - 35.7 g/dL 33.6   33.3   34.1  34.8 R  33.9 R   RDW 11.6 - 15.4 % 13.3   15.4   13.5  13.6 R  13.7 R   Platelets 150 - 450 x10E3/uL 345   292   331  324 R  269 R   Neutrophils Not Estab. % 77   73   75  71 R  65 R   Lymphs Not Estab. % $Remove'13   15   14     'adJBRTw$ Monocytes Not Estab. % $Remove'6   7   7     'UJMEebl$ Eos Not Estab. % $Remove'3   3   3     'TvgvdxQ$ Basos Not Estab. % $Remove'1   1   1     'IHvOtEU$ Neutrophils Absolute 1.4 - 7.0 x10E3/uL 8.6 High    6.3   7.8 High   8.5 High  R  7.4 High  R   Lymphocytes Absolute 0.7 - 3.1 x10E3/uL 1.4   1.3   1.5  2.2 R  2.4 R   Monocytes Absolute 0.1 - 0.9 x10E3/uL 0.6   0.6   0.8     EOS (ABSOLUTE) 0.0 - 0.4 x10E3/uL 0.3   0.3   0.3     Basophils Absolute 0.0 - 0.2 x10E3/uL 0.1   0.1   0.1  0.1 R, CM  0.1 R   Immature Granulocytes Not Estab. 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   ABNORMAL    ____________________________________________   ____________________________________________   INITIAL IMPRESSION / ASSESSMENT AND PLAN  As part of my medical decision making, I reviewed the following data within the [place]       Discussed lab and x-ray results with patient.  Patient elected 55-month lifestyle changes to lower  cholesterol.     ____________________________________________   FINAL CLINICAL IMPRESSION Well exam ED Discharge Orders     None        Note:  This document was prepared using Dragon voice recognition software and may include unintentional dictation errors.

## 2021-04-13 NOTE — Addendum Note (Signed)
Addended by: Gardner Candle on: 04/13/2021 09:42 AM   Modules accepted: Orders

## 2021-04-14 LAB — SPECIMEN STATUS REPORT

## 2021-04-14 LAB — HGB A1C W/O EAG: Hgb A1c MFr Bld: 6.6 % — ABNORMAL HIGH (ref 4.8–5.6)

## 2021-04-17 ENCOUNTER — Encounter: Payer: 59 | Admitting: Physician Assistant

## 2021-06-17 IMAGING — MR MR HEEL *R* W/O CM
7 series · 38 of 40 positions shown · non-contrast
Comparison: Foot radiographs 11/24/2019

CLINICAL DATA: Heel pain for 20 years. Heel spur. Evaluate for
plantar fascial tear, acquired Haglund's deformity.

EXAM:
MR OF THE RIGHT HEEL WITHOUT CONTRAST
TECHNIQUE: Multiplanar, multisequence MR imaging of the right hindfoot was
performed. No intravenous contrast was administered.

[Series 4: T2 fat-sat · axial · 3.0mm · 0.53mm/px · z∈[-100,+32]mm · 6 of 35 slices shown (1 of 3)]
[im 1/35]
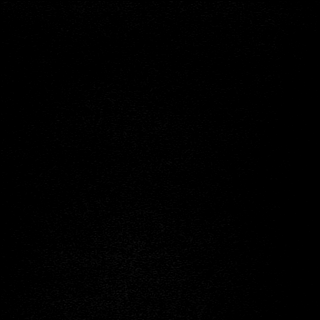
[im 7/35]
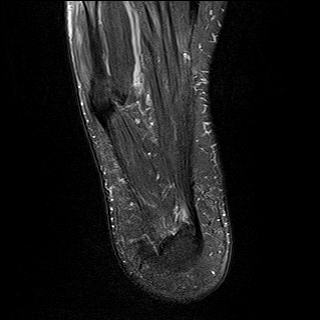
[im 14/35]
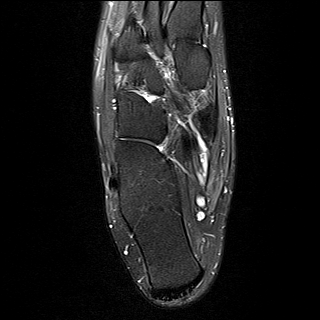
[im 21/35]
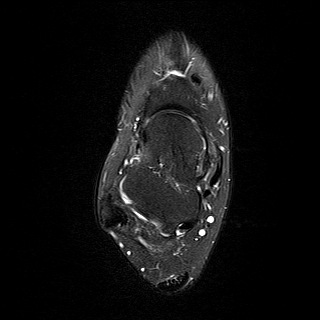
[im 28/35]
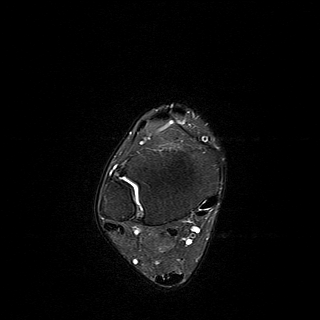
[im 35/35]
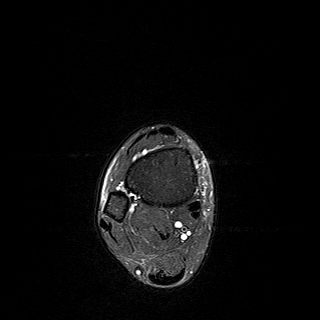

[Series 5: T1 · axial · 3.0mm · 0.66mm/px · z∈[-100,+32]mm · 7 of 35 slices shown (1 of 2)]
[im 1/35]
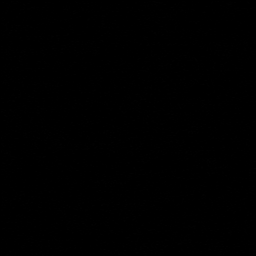
[im 6/35]
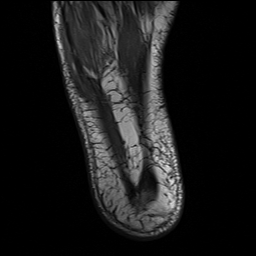
[im 12/35]
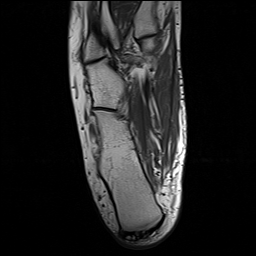
[im 18/35]
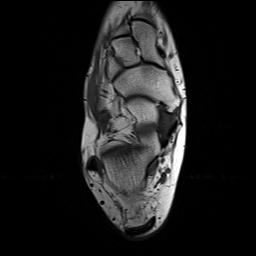
[im 23/35]
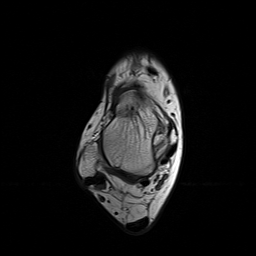
[im 29/35]
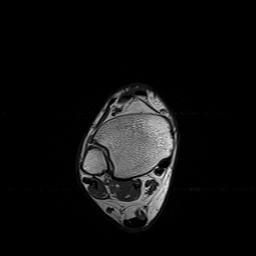
[im 35/35]
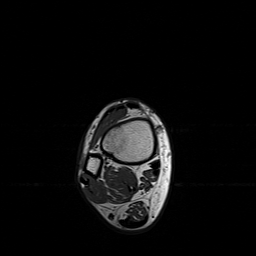

[Series 6: T1 · sagittal · 4.0mm · 0.56mm/px · 4 of 20 slices shown (2 of 2)]
[im 1/20]
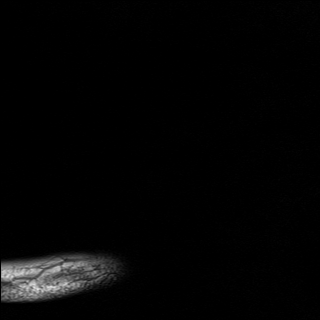
[im 7/20]
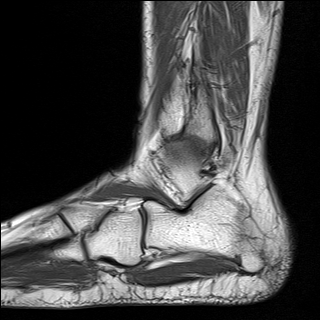
[im 13/20]
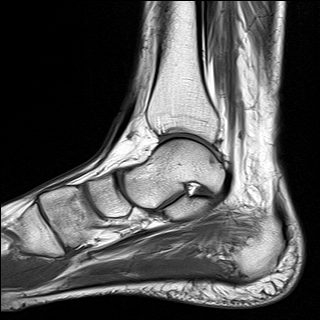
[im 20/20]
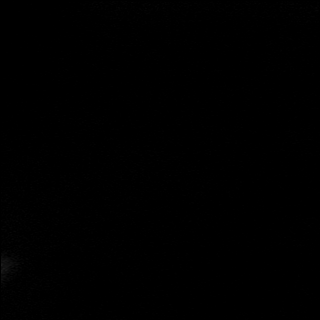

[Series 7: STIR · sagittal · 4.0mm · 0.35mm/px · 4 of 20 slices shown (1 of 2)]
[im 1/20]
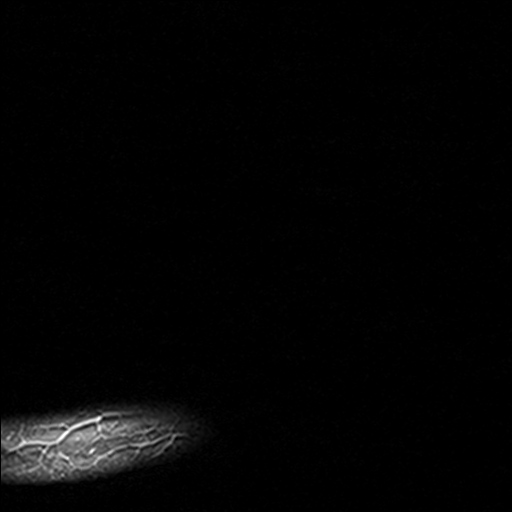
[im 7/20]
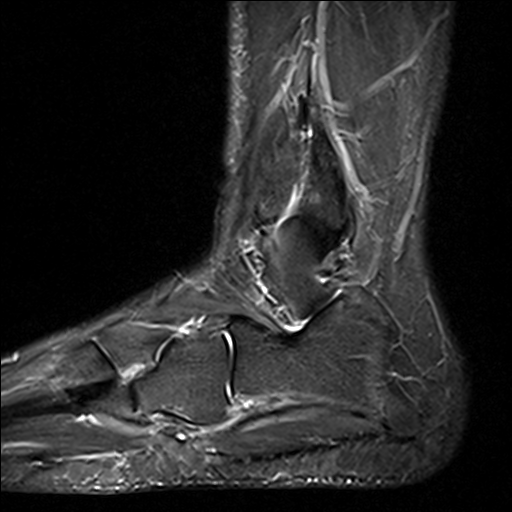
[im 13/20]
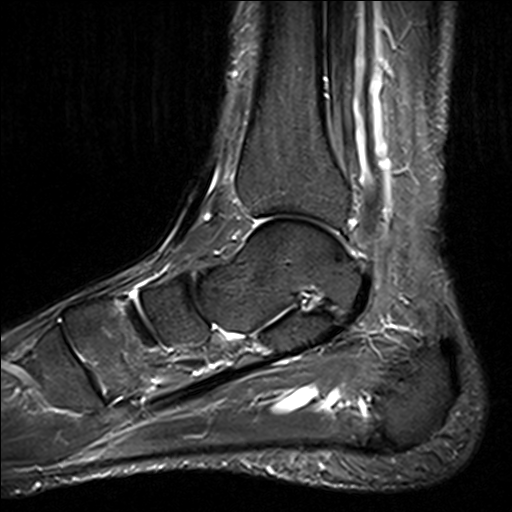
[im 20/20]
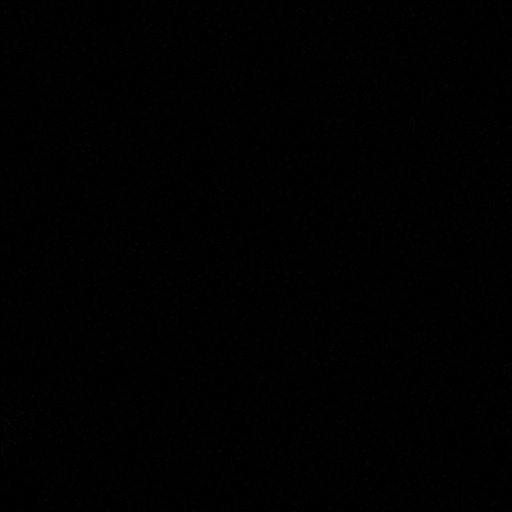

[Series 8: T2 fat-sat · coronal · 3.0mm · 0.53mm/px · 7 of 38 slices shown (2 of 3)]
[im 1/38]
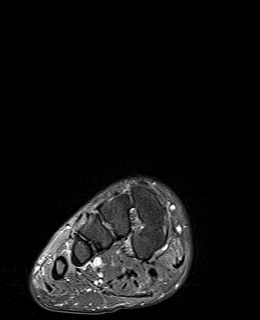
[im 7/38]
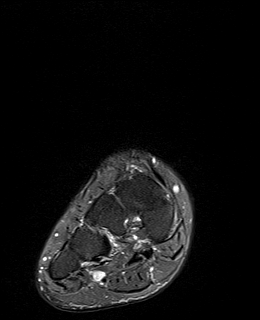
[im 13/38]
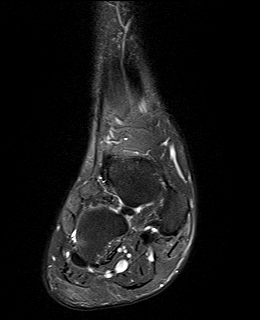
[im 19/38]
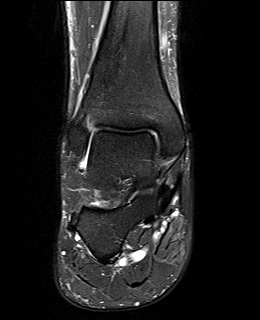
[im 25/38]
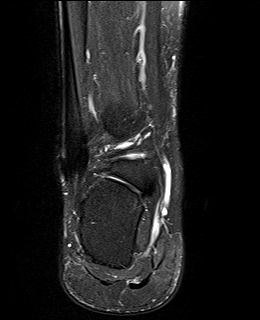
[im 31/38]
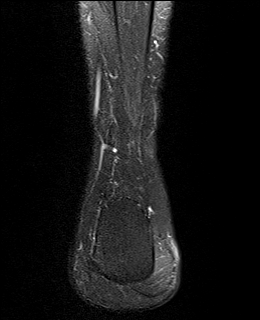
[im 38/38]
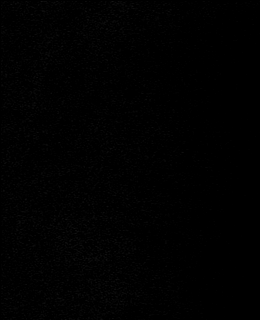

[Series 9: STIR · coronal · 3.0mm · 0.33mm/px · 5 of 38 slices shown (2 of 2)]
[im 1/38]
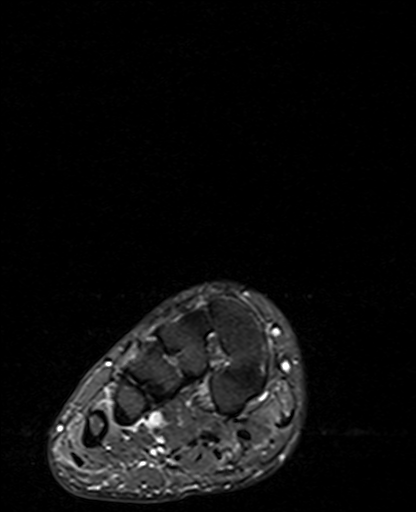
[im 7/38]
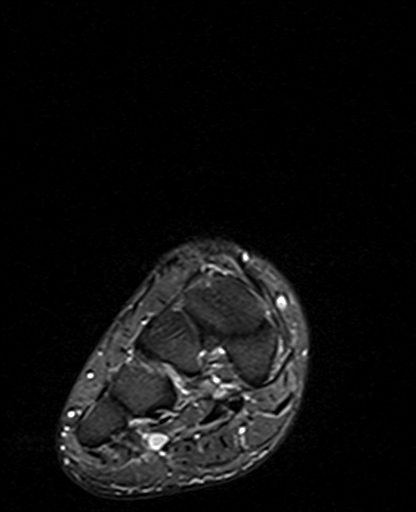
[im 13/38]
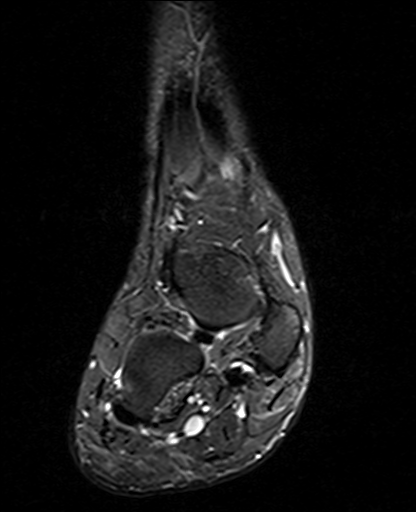
[im 19/38]
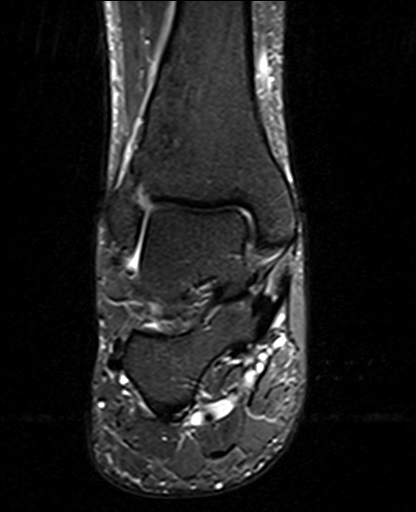
[im 25/38]
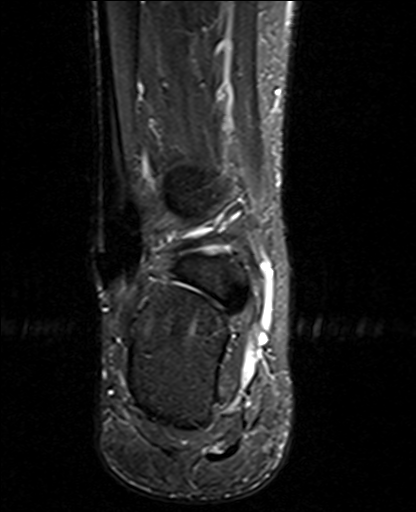

[Series 10: T2 fat-sat · sagittal · 3.0mm · 0.70mm/px · 5 of 29 slices shown (3 of 3)]
[im 1/29]
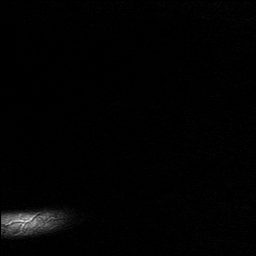
[im 8/29]
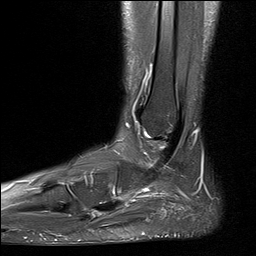
[im 15/29]
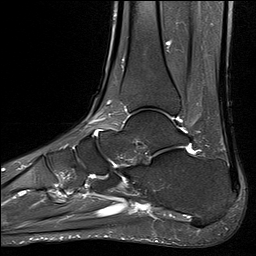
[im 22/29]
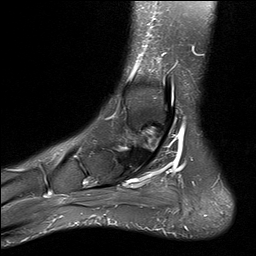
[im 29/29]
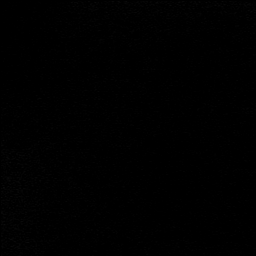

[38 of 40 positions shown; findings below may reference images not displayed]

FINDINGS: TENDONS

Peroneal: Intact and normally positioned.

Posteromedial: Intact and normally positioned.

Anterior: Intact and normally positioned.

Achilles: Intact. Mild enthesophyte formation at the Achilles
insertion on the calcaneal tuberosity without reactive marrow edema,
Haglund deformity or bursal fluid collection.

Plantar Fascia: Intact. There is minimal T2 hyperintensity
surrounding the calcaneal attachment of the central cord. There is
no fascial rupture. There is a small plantar calcaneal spur without
reactive marrow edema.

LIGAMENTS

Lateral: The anterior and posterior talofibular and calcaneofibular
ligaments are intact.

Medial: The deltoid and visualized portions of the spring ligament
appear intact.

CARTILAGE AND BONES

Ankle Joint: No significant ankle joint effusion. The talar dome and
tibial plafond are intact.

Subtalar Joints/Sinus Tarsi: Unremarkable.

Bones: No significant extra-articular osseous findings.

Other: No significant soft tissue findings.
IMPRESSION: 1. Minimal T2 hyperintensity surrounding the calcaneal attachment of
the central cord of the plantar fascia. No fascial rupture.
2. Small posterior and plantar calcaneal spurs without reactive
marrow edema.
3. The additional ankle tendons and ligaments appear normal. No
acute osseous findings.

## 2021-08-21 ENCOUNTER — Telehealth: Payer: Self-pay

## 2021-08-21 DIAGNOSIS — Z1211 Encounter for screening for malignant neoplasm of colon: Secondary | ICD-10-CM

## 2021-08-21 NOTE — Telephone Encounter (Signed)
Wayne Hernandez completed his annual physical a couple of months ago.  Called requesting GI referral for colonoscopy.  Referral faxed to [facility] GI Dept.  AMD

## 2021-09-28 ENCOUNTER — Other Ambulatory Visit: Payer: Self-pay | Admitting: Physician Assistant

## 2021-09-28 DIAGNOSIS — I10 Essential (primary) hypertension: Secondary | ICD-10-CM

## 2021-10-08 ENCOUNTER — Other Ambulatory Visit: Payer: Self-pay

## 2021-10-08 DIAGNOSIS — E0800 Diabetes mellitus due to underlying condition with hyperosmolarity without nonketotic hyperglycemic-hyperosmolar coma (NKHHC): Secondary | ICD-10-CM

## 2021-10-08 DIAGNOSIS — I10 Essential (primary) hypertension: Secondary | ICD-10-CM

## 2021-10-08 MED ORDER — LISINOPRIL 10 MG PO TABS: 10.0000 mg | ORAL_TABLET | Freq: Every day | ORAL | 3 refills | Status: DC

## 2021-10-08 MED ORDER — METFORMIN HCL 1000 MG PO TABS: 1000.0000 mg | ORAL_TABLET | Freq: Two times a day (BID) | ORAL | 3 refills | Status: DC

## 2021-10-17 ENCOUNTER — Other Ambulatory Visit: Payer: 59

## 2021-10-17 DIAGNOSIS — R7309 Other abnormal glucose: Secondary | ICD-10-CM

## 2021-10-17 DIAGNOSIS — E7800 Pure hypercholesterolemia, unspecified: Secondary | ICD-10-CM

## 2021-10-17 NOTE — Progress Notes (Signed)
Pt presents today for 6 month labs.

## 2021-10-18 LAB — CMP12+LP+TP+TSH+6AC+PSA+CBC…
ALT: 17 IU/L (ref 0–44)
AST: 18 IU/L (ref 0–40)
Albumin/Globulin Ratio: 1.6 (ref 1.2–2.2)
Albumin: 4.4 g/dL (ref 3.8–4.9)
Alkaline Phosphatase: 111 IU/L (ref 44–121)
BUN/Creatinine Ratio: 15 (ref 9–20)
BUN: 14 mg/dL (ref 6–24)
Basophils Absolute: 0.1 10*3/uL (ref 0.0–0.2)
Basos: 1 %
Bilirubin Total: 0.5 mg/dL (ref 0.0–1.2)
Calcium: 8.9 mg/dL (ref 8.7–10.2)
Chloride: 97 mmol/L (ref 96–106)
Chol/HDL Ratio: 5.3 ratio — ABNORMAL HIGH (ref 0.0–5.0)
Cholesterol, Total: 211 mg/dL — ABNORMAL HIGH (ref 100–199)
Creatinine, Ser: 0.93 mg/dL (ref 0.76–1.27)
EOS (ABSOLUTE): 0.3 10*3/uL (ref 0.0–0.4)
Eos: 3 %
Estimated CHD Risk: 1.1 times avg. — ABNORMAL HIGH (ref 0.0–1.0)
Free Thyroxine Index: 1.8 (ref 1.2–4.9)
GGT: 29 IU/L (ref 0–65)
Globulin, Total: 2.7 g/dL (ref 1.5–4.5)
Glucose: 121 mg/dL — ABNORMAL HIGH (ref 70–99)
HDL: 40 mg/dL (ref >39)
Hematocrit: 52.9 % — ABNORMAL HIGH (ref 37.5–51.0)
Hemoglobin: 17.9 g/dL — ABNORMAL HIGH (ref 13.0–17.7)
Immature Grans (Abs): 0 10*3/uL (ref 0.0–0.1)
Immature Granulocytes: 0 %
Iron: 110 ug/dL (ref 38–169)
LDH: 161 IU/L (ref 121–224)
LDL Chol Calc (NIH): 141 mg/dL — ABNORMAL HIGH (ref 0–99)
Lymphocytes Absolute: 1.8 10*3/uL (ref 0.7–3.1)
Lymphs: 18 %
MCH: 27.4 pg (ref 26.6–33.0)
MCHC: 33.8 g/dL (ref 31.5–35.7)
MCV: 81 fL (ref 79–97)
Monocytes Absolute: 0.6 10*3/uL (ref 0.1–0.9)
Monocytes: 6 %
Neutrophils Absolute: 7.5 10*3/uL — ABNORMAL HIGH (ref 1.4–7.0)
Neutrophils: 72 %
Phosphorus: 2.6 mg/dL — ABNORMAL LOW (ref 2.8–4.1)
Platelets: 337 10*3/uL (ref 150–450)
Potassium: 4.7 mmol/L (ref 3.5–5.2)
Prostate Specific Ag, Serum: 0.8 ng/mL (ref 0.0–4.0)
RBC: 6.54 x10E6/uL — ABNORMAL HIGH (ref 4.14–5.80)
RDW: 13.8 % (ref 11.6–15.4)
Sodium: 136 mmol/L (ref 134–144)
T3 Uptake Ratio: 22 % — ABNORMAL LOW (ref 24–39)
T4, Total: 8 ug/dL (ref 4.5–12.0)
TSH: 1.04 u[IU]/mL (ref 0.450–4.500)
Total Protein: 7.1 g/dL (ref 6.0–8.5)
Triglycerides: 165 mg/dL — ABNORMAL HIGH (ref 0–149)
Uric Acid: 7.2 mg/dL (ref 3.8–8.4)
VLDL Cholesterol Cal: 30 mg/dL (ref 5–40)
WBC: 10.3 10*3/uL (ref 3.4–10.8)
eGFR: 99 mL/min/{1.73_m2} (ref >59)

## 2021-10-18 LAB — HGB A1C W/O EAG: Hgb A1c MFr Bld: 6.9 % — ABNORMAL HIGH (ref 4.8–5.6)

## 2021-10-29 ENCOUNTER — Encounter: Payer: Self-pay | Admitting: Physician Assistant

## 2021-10-29 ENCOUNTER — Ambulatory Visit: Payer: Self-pay | Admitting: Physician Assistant

## 2021-10-29 VITALS — BP 147/90 | HR 101 | Resp 14 | Ht 71.0 in | Wt 254.0 lb

## 2021-10-29 DIAGNOSIS — E782 Mixed hyperlipidemia: Secondary | ICD-10-CM

## 2021-10-29 DIAGNOSIS — E0800 Diabetes mellitus due to underlying condition with hyperosmolarity without nonketotic hyperglycemic-hyperosmolar coma (NKHHC): Secondary | ICD-10-CM

## 2021-10-29 MED ORDER — EMPAGLIFLOZIN 10 MG PO TABS: 10.0000 mg | ORAL_TABLET | Freq: Every day | ORAL | 3 refills | Status: DC

## 2021-10-29 MED ORDER — ROSUVASTATIN CALCIUM 40 MG PO TABS: 40.0000 mg | ORAL_TABLET | Freq: Every day | ORAL | 3 refills | Status: DC

## 2021-10-29 NOTE — Progress Notes (Signed)
   Subjective: Diabetes and hyperlipidemia    Patient ID: Wayne Hernandez, male    DOB: Oct 05, 1970, 51 y.o.   MRN: [id number]  HPI Patient presents for 85-month follow-up for diabetes and hyperlipidemia.  Patient hemoglobin A1c has increased from 6.6-6.9 in the last 6 months.  Patient stated no change in diet and no increased weight loss.  Patient also has an increase of his triglycerides to 165 from 136.  Patient cholesterol has decreased from 234 to 211.   Review of Systems Negative except for above complaints.    Objective:   Physical Exam  BP is 147/90, pulse 101, respiration 14, patient is 96% O2 sat on room air.  Patient weighs 254 pounds and BMI is 35.43.      Assessment & Plan: Diabetes and mixed hyperlipidemia.   Patient given a prescription for Crestor 20 mg and Jardiance 10 mg.  Patient will follow-up in 6 months or for routine physical exam.

## 2021-10-29 NOTE — Progress Notes (Signed)
Pt presents today for 6 month follow up/results.

## 2022-01-28 ENCOUNTER — Other Ambulatory Visit: Payer: Self-pay

## 2022-01-28 MED ORDER — EMPAGLIFLOZIN 10 MG PO TABS: 10.0000 mg | ORAL_TABLET | Freq: Every day | ORAL | 3 refills | Status: DC

## 2022-02-18 DIAGNOSIS — Z1211 Encounter for screening for malignant neoplasm of colon: Secondary | ICD-10-CM | POA: Diagnosis not present

## 2022-02-18 DIAGNOSIS — Z01818 Encounter for other preprocedural examination: Secondary | ICD-10-CM | POA: Diagnosis not present

## 2022-04-24 ENCOUNTER — Ambulatory Visit: Payer: Self-pay

## 2022-04-24 DIAGNOSIS — R81 Glycosuria: Secondary | ICD-10-CM

## 2022-04-24 DIAGNOSIS — Z0000 Encounter for general adult medical examination without abnormal findings: Secondary | ICD-10-CM

## 2022-04-24 LAB — POCT URINALYSIS DIPSTICK
Bilirubin, UA: NEGATIVE
Blood, UA: NEGATIVE
Glucose, UA: POSITIVE — AB
Ketones, UA: NEGATIVE
Leukocytes, UA: NEGATIVE
Nitrite, UA: NEGATIVE
Protein, UA: POSITIVE — AB
Spec Grav, UA: 1.02 (ref 1.010–1.025)
Urobilinogen, UA: 0.2 E.U./dL
pH, UA: 5.5 (ref 5.0–8.0)

## 2022-04-24 NOTE — Progress Notes (Signed)
Labs and urine obtained without difficulty as ordered pre annual physical for COB.

## 2022-04-25 LAB — CMP12+LP+TP+TSH+6AC+PSA+CBC…
ALT: 16 IU/L (ref 0–44)
AST: 24 IU/L (ref 0–40)
Albumin/Globulin Ratio: 1.4 (ref 1.2–2.2)
Albumin: 4.6 g/dL (ref 3.8–4.9)
Alkaline Phosphatase: 123 IU/L — ABNORMAL HIGH (ref 44–121)
BUN/Creatinine Ratio: 14 (ref 9–20)
BUN: 14 mg/dL (ref 6–24)
Basophils Absolute: 0.1 10*3/uL (ref 0.0–0.2)
Basos: 1 %
Bilirubin Total: 0.4 mg/dL (ref 0.0–1.2)
Calcium: 9.3 mg/dL (ref 8.7–10.2)
Chloride: 99 mmol/L (ref 96–106)
Chol/HDL Ratio: 3.4 ratio (ref 0.0–5.0)
Cholesterol, Total: 121 mg/dL (ref 100–199)
Creatinine, Ser: 0.97 mg/dL (ref 0.76–1.27)
EOS (ABSOLUTE): 0.3 10*3/uL (ref 0.0–0.4)
Eos: 3 %
Estimated CHD Risk: 0.5 times avg. (ref 0.0–1.0)
Free Thyroxine Index: 2 (ref 1.2–4.9)
GGT: 26 IU/L (ref 0–65)
Globulin, Total: 3.2 g/dL (ref 1.5–4.5)
Glucose: 112 mg/dL — ABNORMAL HIGH (ref 70–99)
HDL: 36 mg/dL — ABNORMAL LOW (ref >39)
Hematocrit: 53.7 % — ABNORMAL HIGH (ref 37.5–51.0)
Hemoglobin: 17.7 g/dL (ref 13.0–17.7)
Immature Grans (Abs): 0 10*3/uL (ref 0.0–0.1)
Immature Granulocytes: 0 %
Iron: 70 ug/dL (ref 38–169)
LDH: 213 IU/L (ref 121–224)
LDL Chol Calc (NIH): 66 mg/dL (ref 0–99)
Lymphocytes Absolute: 1.3 10*3/uL (ref 0.7–3.1)
Lymphs: 15 %
MCH: 26.5 pg — ABNORMAL LOW (ref 26.6–33.0)
MCHC: 33 g/dL (ref 31.5–35.7)
MCV: 80 fL (ref 79–97)
Monocytes Absolute: 0.6 10*3/uL (ref 0.1–0.9)
Monocytes: 7 %
Neutrophils Absolute: 6.4 10*3/uL (ref 1.4–7.0)
Neutrophils: 74 %
Phosphorus: 2.7 mg/dL — ABNORMAL LOW (ref 2.8–4.1)
Platelets: 309 10*3/uL (ref 150–450)
Potassium: 4.7 mmol/L (ref 3.5–5.2)
Prostate Specific Ag, Serum: 0.3 ng/mL (ref 0.0–4.0)
RBC: 6.69 x10E6/uL — ABNORMAL HIGH (ref 4.14–5.80)
RDW: 14.5 % (ref 11.6–15.4)
Sodium: 136 mmol/L (ref 134–144)
T3 Uptake Ratio: 24 % (ref 24–39)
T4, Total: 8.3 ug/dL (ref 4.5–12.0)
TSH: 0.828 u[IU]/mL (ref 0.450–4.500)
Total Protein: 7.8 g/dL (ref 6.0–8.5)
Triglycerides: 102 mg/dL (ref 0–149)
Uric Acid: 3.9 mg/dL (ref 3.8–8.4)
VLDL Cholesterol Cal: 19 mg/dL (ref 5–40)
WBC: 8.7 10*3/uL (ref 3.4–10.8)
eGFR: 94 mL/min/{1.73_m2} (ref >59)

## 2022-04-25 LAB — HGB A1C W/O EAG: Hgb A1c MFr Bld: 6.6 % — ABNORMAL HIGH (ref 4.8–5.6)

## 2022-05-01 ENCOUNTER — Ambulatory Visit: Payer: Self-pay | Admitting: Physician Assistant

## 2022-05-01 ENCOUNTER — Encounter: Payer: Self-pay | Admitting: Physician Assistant

## 2022-05-01 VITALS — BP 121/81 | HR 88 | Temp 97.7°F | Resp 14 | Wt 247.0 lb

## 2022-05-01 DIAGNOSIS — Z0000 Encounter for general adult medical examination without abnormal findings: Secondary | ICD-10-CM

## 2022-05-01 NOTE — Progress Notes (Signed)
Here for physical with provider for COB.

## 2022-05-01 NOTE — Progress Notes (Signed)
[facility]  ____________________________________________   None    (approximate)  I have reviewed the triage vital signs and the nursing notes.   HISTORY  Chief Complaint No chief complaint on file.   HPI Wayne Hernandez is a 52 y.o. male patient presents for annual physical exam.  Patient voiced no concerns or complaints.         Past Medical History:  Diagnosis Date   Diabetes ([place])    Diverticulitis    Elevated hemoglobin A1c    Elevated uric acid in blood    Esophagitis    Hypertension    Low HDL (under 40)    Overweight     Patient Active Problem List   Diagnosis Date Noted   Polycythemia, secondary 11/26/2016    Past Surgical History:  Procedure Laterality Date   FOOT SURGERY Right 12/31/2019    Prior to Admission medications   Medication Sig Start Date End Date Taking? Authorizing Provider  aspirin EC 81 MG tablet Take 81 mg by mouth daily.   Yes [provider]  empagliflozin (JARDIANCE) 10 MG TABS tablet Take 1 tablet (10 mg total) by mouth daily before breakfast. 01/28/22  Yes Sable Feil, PA-C  lisinopril (ZESTRIL) 10 MG tablet Take 1 tablet (10 mg total) by mouth daily. 10/08/21  Yes Sable Feil, PA-C  metFORMIN (GLUCOPHAGE) 1000 MG tablet Take 1 tablet (1,000 mg total) by mouth 2 (two) times daily with a meal. 10/08/21  Yes Sable Feil, PA-C  rosuvastatin (CRESTOR) 40 MG tablet Take 1 tablet (40 mg total) by mouth daily. 10/29/21  Yes Sable Feil, PA-C    Allergies Patient has no known allergies.  Family History  Problem Relation Age of Onset   Liver disease Mother        72 yrs S/P transplant   Colon cancer Paternal Grandfather     Social History Social History   Tobacco Use   Smoking status: Never   Smokeless tobacco: Never  Vaping Use   Vaping Use: Never used  Substance Use Topics   Alcohol use: Yes    Comment: occasional    Review of Systems  Constitutional: No  fever/chills Eyes: No visual changes. ENT: No sore throat. Cardiovascular: Denies chest pain. Respiratory: Denies shortness of breath. Gastrointestinal: No abdominal pain.  No nausea, no vomiting.  No diarrhea.  No constipation. Genitourinary: Negative for dysuria. Musculoskeletal: Negative for back pain. Skin: Negative for rash. Neurological: Negative for headaches, focal weakness or numbness. Endocrine: Diabetes, hyperlipidemia, hypertension Allergic/Immunilogical: Polycythemia ____________________________________________   PHYSICAL EXAM:  VITAL SIGNS: BP is 121/81, pulse 88, respiration 14, temperature 97.7, and patient 94% O2 sat on room air.  Patient weighs 247 pounds BMI is 34.45. Constitutional: Alert and oriented. Well appearing and in no acute distress. Eyes: Conjunctivae are normal. PERRL. EOMI. Head: Atraumatic. Nose: No congestion/rhinnorhea. Mouth/Throat: Mucous membranes are moist.  Oropharynx non-erythematous. Neck: No stridor.  No cervical spine tenderness to palpation. Hematological/Lymphatic/Immunilogical: No cervical lymphadenopathy. Cardiovascular: Normal rate, regular rhythm. Grossly normal heart sounds.  Good peripheral circulation. Respiratory: Normal respiratory effort.  No retractions. Lungs CTAB. Gastrointestinal: Soft and nontender. No distention. No abdominal bruits. No CVA tenderness. Genitourinary: Deferred Musculoskeletal: No lower extremity tenderness nor edema.  No joint effusions. Neurologic:  Normal speech and language. No gross focal neurologic deficits are appreciated. No gait instability. Skin:  Skin is warm, dry and intact. No rash noted. Psychiatric: Mood and affect are normal. Speech and behavior are  normal.  ____________________________________________   LABS         Component Ref Range & Units 7 d ago (04/24/22) 1 yr ago (04/09/21) 1 yr ago (08/28/20) 2 yr ago (06/08/19) 7 yr ago (07/14/14)  Color, UA yellow yellow yellow Dark Yellow    Clarity, UA clear clear clear Clear   Glucose, UA Negative Positive Abnormal  Negative Negative Negative   Comment: 3+  Bilirubin, UA neg negative negative Negative   Ketones, UA neg negative negative Negative   Spec Grav, UA 1.010 - 1.025 1.020 1.010 >=1.030 Abnormal  1.020   Blood, UA neg negative negative Negative   pH, UA 5.0 - 8.0 5.5 6.0 5.5 6.0   Protein, UA Negative Positive Abnormal  Negative Positive Abnormal  CM Negative   Comment: 15  Urobilinogen, UA 0.2 or 1.0 E.U./dL 0.2 0.2 0.2 0.2   Nitrite, UA neg negative negative Negative   Leukocytes, UA Negative Negative Trace Abnormal  Negative Moderate (2+) Abnormal  TRACE Abnormal  R  Appearance dark  dark  CLEAR Abnormal  R  Odor       Resulting Agency     [facility]               Component Ref Range & Units 7 d ago (04/24/22) 6 mo ago (10/17/21) 1 yr ago (04/09/21) 1 yr ago (09/14/20) 2 yr ago (03/23/20) 2 yr ago (12/17/19) 2 yr ago (06/08/19)  Glucose 70 - 99 mg/dL 112 High  121 High  117 High   121 High  R  150 High  R  Uric Acid 3.8 - 8.4 mg/dL 3.9 7.2 CM 7.1 CM  8.0 CM  8.2 CM  Comment:            Therapeutic target for gout patients: <6.0  BUN 6 - 24 mg/dL 14 14 11 12 11  9   Creatinine, Ser 0.76 - 1.27 mg/dL 0.97 0.93 0.97 0.93 0.92  1.02  eGFR >59 mL/min/1.73 94 99 95 100     BUN/Creatinine Ratio 9 - 20 14 15 11 13 12  9   Sodium 134 - 144 mmol/L 136 136 135  135  137  Potassium 3.5 - 5.2 mmol/L 4.7 4.7 4.4  4.5  4.7  Chloride 96 - 106 mmol/L 99 97 95 Low   98  99  Calcium 8.7 - 10.2 mg/dL 9.3 8.9 9.3  8.8  9.1  Phosphorus 2.8 - 4.1 mg/dL 2.7 Low  2.6 Low  2.4 Low   2.8  2.3 Low   Total Protein 6.0 - 8.5 g/dL 7.8 7.1 7.8  7.1  7.3  Albumin 3.8 - 4.9 g/dL 4.6 4.4 4.7  4.1 R  4.0 R  Globulin, Total 1.5 - 4.5 g/dL 3.2 2.7 3.1  3.0  3.3  Albumin/Globulin Ratio 1.2 - 2.2 1.4 1.6 1.5  1.4  1.2  Bilirubin Total 0.0 - 1.2 mg/dL 0.4 0.5 0.5  0.3  0.5  Alkaline Phosphatase 44 - 121 IU/L 123 High   111 109  113  119 High  R  LDH 121 - 224 IU/L 213 161 168  151  161  AST 0 - 40 IU/L 24 18 22  26  20   ALT 0 - 44 IU/L 16 17 17  21  18   GGT 0 - 65 IU/L 26 29 24   32  26  Iron 38 - 169 ug/dL 70 110 94  103  86  Cholesterol, Total 100 - 199 mg/dL 121 211  High  234 High   200 High  209 High  202 High   Triglycerides 0 - 149 mg/dL 102 165 High  136  124 135 117  HDL >39 mg/dL 36 Low  40 42  40 37 Low  38 Low   VLDL Cholesterol Cal 5 - 40 mg/dL 19 30 25  22 25 21   LDL Chol Calc (NIH) 0 - 99 mg/dL 66 141 High  167 High   138 High  147 High  143 High   Chol/HDL Ratio 0.0 - 5.0 ratio 3.4 5.3 High  CM 5.6 High  CM  5.0 CM 5.6 High  CM 5.3 High  CM  Comment:                                   T. Chol/HDL Ratio                                             Men  Women                               1/2 Avg.Risk  3.4    3.3                                   Avg.Risk  5.0    4.4                                2X Avg.Risk  9.6    7.1                                3X Avg.Risk 23.4   11.0  Estimated CHD Risk 0.0 - 1.0 times avg. 0.5 1.1 High  CM 1.2 High  CM  1.0 CM  1.1 High  CM  Comment: The CHD Risk is based on the T. Chol/HDL ratio. Other factors affect CHD Risk such as hypertension, smoking, diabetes, severe obesity, and family history of premature CHD.  TSH 0.450 - 4.500 uIU/mL 0.828 1.040 0.908  0.816  0.715  T4, Total 4.5 - 12.0 ug/dL 8.3 8.0 10.0  9.1  8.8  T3 Uptake Ratio 24 - 39 % 24 22 Low  23 Low   26  23 Low   Free Thyroxine Index 1.2 - 4.9 2.0 1.8 2.3  2.4  2.0  Prostate Specific Ag, Serum 0.0 - 4.0 ng/mL 0.3 0.8 CM 0.5 CM  0.3 CM  0.4 CM  Comment: Roche ECLIA methodology. According to the American Urological Association, Serum PSA should decrease and remain at undetectable levels after radical prostatectomy. The AUA defines biochemical recurrence as an initial PSA value 0.2 ng/mL or greater followed by a subsequent confirmatory PSA value 0.2 ng/mL or greater. Values  obtained with different assay methods or kits cannot be used interchangeably. Results cannot be interpreted as absolute evidence of the presence or absence of malignant disease.  WBC 3.4 - 10.8 x10E3/uL 8.7 10.3 11.1 High   8.6  10.5  RBC 4.14 - 5.80 x10E6/uL 6.69 High  6.54 High  6.73 High  6.18 High   6.04 High   Hemoglobin 13.0 - 17.7 g/dL 17.7 17.9 High  18.6 High   17.1  17.3  Hematocrit 37.5 - 51.0 % 53.7 High  52.9 High  55.3 High   51.4 High   50.8  MCV 79 - 97 fL 80 81 82  83  84  MCH 26.6 - 33.0 pg 26.5 Low  27.4 27.6  27.7  28.6  MCHC 31.5 - 35.7 g/dL 33.0 33.8 33.6  33.3  34.1  RDW 11.6 - 15.4 % 14.5 13.8 13.3  15.4  13.5  Platelets 150 - 450 x10E3/uL 309 337 345  292  331  Neutrophils Not Estab. % 74 72 77  73  75  Lymphs Not Estab. % 15 18 13  15  14   Monocytes Not Estab. % 7 6 6  7  7   Eos Not Estab. % 3 3 3  3  3   Basos Not Estab. % 1 1 1  1  1   Neutrophils Absolute 1.4 - 7.0 x10E3/uL 6.4 7.5 High  8.6 High   6.3  7.8 High   Lymphocytes Absolute 0.7 - 3.1 x10E3/uL 1.3 1.8 1.4  1.3  1.5  Monocytes Absolute 0.1 - 0.9 x10E3/uL 0.6 0.6 0.6  0.6  0.8  EOS (ABSOLUTE) 0.0 - 0.4 x10E3/uL 0.3 0.3 0.3  0.3  0.3  Basophils Absolute 0.0 - 0.2 x10E3/uL 0.1 0.1 0.1  0.1  0.1  Immature Granulocytes Not Estab. % 0 0 0  1  0  I             _______________________________  EKG Sinus rhythm at 70 bpm.  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 ____________________________________________    ____________________________________________   INITIAL IMPRESSION / ASSESSMENT AND PLAN   As part of my medical decision making, I reviewed the following data within the [place]         Discussed no acute findings on physical exam and EKG.      ____________________________________________   FINAL CLINICAL IMPRESSION Well exam   ED Discharge Orders     None        Note:  This document was prepared  using Dragon voice recognition software and may include unintentional dictation errors.

## 2022-05-16 ENCOUNTER — Encounter: Payer: Self-pay | Admitting: Gastroenterology

## 2022-05-16 NOTE — H&P (Signed)
Pre-Procedure H&P   Patient ID: Wayne Hernandez is a 52 y.o. male.  Gastroenterology Provider: Jaynie Collins, DO  Referring Provider: Nona Dell, PA PCP: Wells Guiles  Date: 05/17/2022  HPI Wayne Hernandez is a 52 y.o. male who presents today for Colonoscopy for Colorectal cancer screening .  Patient undergoing his initial screening colonoscopy.  No family history of colon polyps. Paternal grandfather with colorectal cancer.  Mother with history of liver disease status post transplant  Patient reports twice daily bowel movement without melena hematochezia diarrhea or constipation. Most recent lab work hemoglobin 17.7 MCV 80 platelets 309,000 creatinine 0.97   Past Medical History:  Diagnosis Date   Diabetes    Diverticulitis    Elevated hemoglobin A1c    Elevated uric acid in blood    Esophagitis    Hypertension    Low HDL (under 40)    Overweight     Past Surgical History:  Procedure Laterality Date   FOOT SURGERY Right 12/31/2019    Family History No h/o GI disease or malignancy  Review of Systems  Constitutional:  Negative for activity change, appetite change, chills, diaphoresis, fatigue, fever and unexpected weight change.  HENT:  Negative for trouble swallowing and voice change.   Respiratory:  Negative for shortness of breath and wheezing.   Cardiovascular:  Negative for chest pain, palpitations and leg swelling.  Gastrointestinal:  Negative for abdominal distention, abdominal pain, anal bleeding, blood in stool, constipation, diarrhea, nausea and vomiting.  Musculoskeletal:  Negative for arthralgias and myalgias.  Skin:  Negative for color change and pallor.  Neurological:  Negative for dizziness, syncope and weakness.  Psychiatric/Behavioral:  Negative for confusion. The patient is not nervous/anxious.   All other systems reviewed and are negative.    Medications No current facility-administered medications on file prior to  encounter.   Current Outpatient Medications on File Prior to Encounter  Medication Sig Dispense Refill   aspirin EC 81 MG tablet Take 81 mg by mouth daily.     empagliflozin (JARDIANCE) 10 MG TABS tablet Take 1 tablet (10 mg total) by mouth daily before breakfast. 90 tablet 3   lisinopril (ZESTRIL) 10 MG tablet Take 1 tablet (10 mg total) by mouth daily. 90 tablet 3   metFORMIN (GLUCOPHAGE) 1000 MG tablet Take 1 tablet (1,000 mg total) by mouth 2 (two) times daily with a meal. 180 tablet 3   rosuvastatin (CRESTOR) 40 MG tablet Take 1 tablet (40 mg total) by mouth daily. 90 tablet 3    Pertinent medications related to GI and procedure were reviewed by me with the patient prior to the procedure   Current Facility-Administered Medications:    0.9 %  sodium chloride infusion, , Intravenous, Continuous, Jaynie Collins, DO, Last Rate: 20 mL/hr at 05/17/22 0649, 20 mL/hr at 05/17/22 1155      No Known Allergies Allergies were reviewed by me prior to the procedure  Objective   Body mass index is 33.05 kg/m. Vitals:   05/17/22 0634  BP: (!) 121/97  Pulse: 91  Resp: 20  Temp: (!) 96.1 F (35.6 C)  TempSrc: Temporal  SpO2: 99%  Weight: 107.5 kg  Height: 5\' 11"  (1.803 m)     Physical Exam Vitals and nursing note reviewed.  Constitutional:      General: He is not in acute distress.    Appearance: Normal appearance. He is obese. He is not ill-appearing, toxic-appearing or diaphoretic.  HENT:     Head:  Normocephalic and atraumatic.     Nose: Nose normal.     Mouth/Throat:     Mouth: Mucous membranes are moist.     Pharynx: Oropharynx is clear.  Eyes:     General: No scleral icterus.    Extraocular Movements: Extraocular movements intact.  Cardiovascular:     Rate and Rhythm: Normal rate and regular rhythm.     Heart sounds: Normal heart sounds. No murmur heard.    No friction rub. No gallop.  Pulmonary:     Effort: Pulmonary effort is normal. No respiratory  distress.     Breath sounds: Normal breath sounds. No wheezing, rhonchi or rales.  Abdominal:     General: Bowel sounds are normal. There is no distension.     Palpations: Abdomen is soft.     Tenderness: There is no abdominal tenderness. There is no guarding or rebound.  Musculoskeletal:     Cervical back: Neck supple.     Right lower leg: No edema.     Left lower leg: No edema.  Skin:    General: Skin is warm and dry.     Coloration: Skin is not jaundiced or pale.  Neurological:     General: No focal deficit present.     Mental Status: He is alert and oriented to person, place, and time. Mental status is at baseline.  Psychiatric:        Mood and Affect: Mood normal.        Behavior: Behavior normal.        Thought Content: Thought content normal.        Judgment: Judgment normal.      Assessment:  Wayne Hernandez is a 52 y.o. male  who presents today for Colonoscopy for Colorectal cancer screening .  Plan:  Colonoscopy with possible intervention today  Colonoscopy with possible biopsy, control of bleeding, polypectomy, and interventions as necessary has been discussed with the patient/patient representative. Informed consent was obtained from the patient/patient representative after explaining the indication, nature, and risks of the procedure including but not limited to death, bleeding, perforation, missed neoplasm/lesions, cardiorespiratory compromise, and reaction to medications. Opportunity for questions was given and appropriate answers were provided. Patient/patient representative has verbalized understanding is amenable to undergoing the procedure.   Jaynie Collins, DO  [facility]  Portions of the record may have been created with voice recognition software. Occasional wrong-word or 'sound-a-like' substitutions may have occurred due to the inherent limitations of voice recognition software.  Read the chart carefully and recognize, using  context, where substitutions may have occurred.

## 2022-05-17 ENCOUNTER — Encounter
Admission: RE | Disposition: A | Discharge status: Home / Self Care | Payer: Self-pay | Source: Home / Self Care | Attending: Gastroenterology

## 2022-05-17 ENCOUNTER — Ambulatory Visit: Payer: 59 | Admitting: Anesthesiology

## 2022-05-17 ENCOUNTER — Encounter: Payer: Self-pay | Admitting: Gastroenterology

## 2022-05-17 ENCOUNTER — Ambulatory Visit
Admission: RE | Admit: 2022-05-17 | Discharge: 2022-05-17 | Disposition: A | Discharge status: Home / Self Care | Payer: 59 | Attending: Gastroenterology | Admitting: Gastroenterology

## 2022-05-17 DIAGNOSIS — Z7984 Long term (current) use of oral hypoglycemic drugs: Secondary | ICD-10-CM | POA: Insufficient documentation

## 2022-05-17 DIAGNOSIS — Z79899 Other long term (current) drug therapy: Secondary | ICD-10-CM | POA: Insufficient documentation

## 2022-05-17 DIAGNOSIS — K649 Unspecified hemorrhoids: Secondary | ICD-10-CM | POA: Diagnosis not present

## 2022-05-17 DIAGNOSIS — K635 Polyp of colon: Secondary | ICD-10-CM | POA: Diagnosis not present

## 2022-05-17 DIAGNOSIS — E669 Obesity, unspecified: Secondary | ICD-10-CM | POA: Insufficient documentation

## 2022-05-17 DIAGNOSIS — K640 First degree hemorrhoids: Secondary | ICD-10-CM | POA: Insufficient documentation

## 2022-05-17 DIAGNOSIS — Z8379 Family history of other diseases of the digestive system: Secondary | ICD-10-CM | POA: Diagnosis not present

## 2022-05-17 DIAGNOSIS — Z6833 Body mass index (BMI) 33.0-33.9, adult: Secondary | ICD-10-CM | POA: Diagnosis not present

## 2022-05-17 DIAGNOSIS — Z1211 Encounter for screening for malignant neoplasm of colon: Secondary | ICD-10-CM | POA: Diagnosis not present

## 2022-05-17 DIAGNOSIS — D122 Benign neoplasm of ascending colon: Secondary | ICD-10-CM | POA: Diagnosis not present

## 2022-05-17 DIAGNOSIS — K6389 Other specified diseases of intestine: Secondary | ICD-10-CM | POA: Diagnosis not present

## 2022-05-17 DIAGNOSIS — Z800 Family history of malignant neoplasm of digestive organs: Secondary | ICD-10-CM | POA: Diagnosis not present

## 2022-05-17 DIAGNOSIS — E119 Type 2 diabetes mellitus without complications: Secondary | ICD-10-CM | POA: Diagnosis not present

## 2022-05-17 DIAGNOSIS — D125 Benign neoplasm of sigmoid colon: Secondary | ICD-10-CM | POA: Diagnosis not present

## 2022-05-17 DIAGNOSIS — K5730 Diverticulosis of large intestine without perforation or abscess without bleeding: Secondary | ICD-10-CM | POA: Diagnosis not present

## 2022-05-17 DIAGNOSIS — I10 Essential (primary) hypertension: Secondary | ICD-10-CM | POA: Insufficient documentation

## 2022-05-17 HISTORY — PX: COLONOSCOPY WITH PROPOFOL: SHX5780

## 2022-05-17 LAB — GLUCOSE, CAPILLARY: Glucose-Capillary: 131 mg/dL — ABNORMAL HIGH (ref 70–99)

## 2022-05-17 SURGERY — COLONOSCOPY WITH PROPOFOL
Anesthesia: General

## 2022-05-17 MED ORDER — PROPOFOL 10 MG/ML IV BOLUS
INTRAVENOUS | Status: DC | PRN
  Administered 2022-05-17: 100 mg via INTRAVENOUS
  Administered 2022-05-17: 150 ug/kg/min via INTRAVENOUS

## 2022-05-17 MED ORDER — LIDOCAINE HCL (CARDIAC) PF 100 MG/5ML IV SOSY
PREFILLED_SYRINGE | INTRAVENOUS | Status: DC | PRN
  Administered 2022-05-17: 100 mg via INTRAVENOUS

## 2022-05-17 MED ORDER — SODIUM CHLORIDE 0.9 % IV SOLN
INTRAVENOUS | Status: DC
  Administered 2022-05-17: 20 mL/h via INTRAVENOUS

## 2022-05-17 MED ORDER — LIDOCAINE HCL (PF) 2 % IJ SOLN
INTRAMUSCULAR | Status: AC
  Filled 2022-05-17: qty 30

## 2022-05-17 MED ORDER — PROPOFOL 1000 MG/100ML IV EMUL
INTRAVENOUS | Status: AC
  Filled 2022-05-17: qty 100

## 2022-05-17 MED ORDER — STERILE WATER FOR IRRIGATION IR SOLN
Status: DC | PRN
  Administered 2022-05-17: 60 mL

## 2022-05-17 NOTE — Anesthesia Preprocedure Evaluation (Signed)
Anesthesia Evaluation  Patient identified by MRN, date of birth, ID band Patient awake    Reviewed: Allergy & Precautions, NPO status , Patient's Chart, lab work & pertinent test results  Airway Mallampati: II  TM Distance: >3 FB Neck ROM: full    Dental  (+) Teeth Intact   Pulmonary neg pulmonary ROS   Pulmonary exam normal breath sounds clear to auscultation       Cardiovascular Exercise Tolerance: Good hypertension, Pt. on medications negative cardio ROS Normal cardiovascular exam Rhythm:Regular Rate:Normal     Neuro/Psych negative neurological ROS  negative psych ROS   GI/Hepatic negative GI ROS, Neg liver ROS,,,  Endo/Other  negative endocrine ROSdiabetes, Type 2, Oral Hypoglycemic Agents  Morbid obesity  Renal/GU negative Renal ROS  negative genitourinary   Musculoskeletal negative musculoskeletal ROS (+)    Abdominal  (+) + obese  Peds negative pediatric ROS (+)  Hematology negative hematology ROS (+)   Anesthesia Other Findings Past Medical History: No date: Diabetes No date: Diverticulitis No date: Elevated hemoglobin A1c No date: Elevated uric acid in blood No date: Esophagitis No date: Hypertension No date: Low HDL (under 40) No date: Overweight  Past Surgical History: 12/31/2019: FOOT SURGERY; Right  BMI    Body Mass Index: 33.05 kg/m      Reproductive/Obstetrics negative OB ROS                             Anesthesia Physical Anesthesia Plan  ASA: 3  Anesthesia Plan: General   Post-op Pain Management:    Induction: Intravenous  PONV Risk Score and Plan: Propofol infusion and TIVA  Airway Management Planned: Natural Airway  Additional Equipment:   Intra-op Plan:   Post-operative Plan:   Informed Consent: I have reviewed the patients History and Physical, chart, labs and discussed the procedure including the risks, benefits and alternatives for the  proposed anesthesia with the patient or authorized representative who has indicated his/her understanding and acceptance.     Dental Advisory Given  Plan Discussed with: Anesthesiologist, CRNA and Surgeon  Anesthesia Plan Comments:        Anesthesia Quick Evaluation

## 2022-05-17 NOTE — Interval H&P Note (Signed)
History and Physical Interval Note: Preprocedure H&P from 05/17/22  was reviewed and there was no interval change after seeing and examining the patient.  Written consent was obtained from the patient after discussion of risks, benefits, and alternatives. Patient has consented to proceed with Colonoscopy with possible intervention   05/17/2022 7:23 AM  Wayne Hernandez  has presented today for surgery, with the diagnosis of colon cancer screening.  The various methods of treatment have been discussed with the patient and family. After consideration of risks, benefits and other options for treatment, the patient has consented to  Procedure(s): COLONOSCOPY WITH PROPOFOL (N/A) as a surgical intervention.  The patient's history has been reviewed, patient examined, no change in status, stable for surgery.  I have reviewed the patient's chart and labs.  Questions were answered to the patient's satisfaction.     Jaynie Collins

## 2022-05-17 NOTE — Op Note (Signed)
[facility] Patient Name: Wayne Hernandez Procedure Date: 05/17/2022 7:30 AM MRN: [id number] Account #: [id number] Date of Birth: 07-15-1970 Admit Type: Outpatient Age: 52 Room: [facility] ENDO ROOM 1 Gender: Male Note Status: Finalized Instrument Name: Colonoscope [id number] Procedure:             Colonoscopy Indications:           Screening for colorectal malignant neoplasm Providers:             Trenda Moots, DO Referring MD:          Joni Reining, MD (Referring MD) Medicines:             Monitored Anesthesia Care Complications:         No immediate complications. Estimated blood loss:                         Minimal. Procedure:             Pre-Anesthesia Assessment:                        - Prior to the procedure, a History and Physical was                         performed, and patient medications and allergies were                         reviewed. The patient is competent. The risks and                         benefits of the procedure and the sedation options and                         risks were discussed with the patient. All questions                         were answered and informed consent was obtained.                         Patient identification and proposed procedure were                         verified by the physician, the nurse, the anesthetist                         and the technician in the endoscopy suite. Mental                         Status Examination: alert and oriented. Airway                         Examination: normal oropharyngeal airway and neck                         mobility. Respiratory Examination: clear to                         auscultation. CV Examination: RRR, no murmurs, no S3  or S4. Prophylactic Antibiotics: The patient does not                         require prophylactic antibiotics. Prior                         Anticoagulants: The patient has taken no anticoagulant                          or antiplatelet agents. ASA Grade Assessment: III - A                         patient with severe systemic disease. After reviewing                         the risks and benefits, the patient was deemed in                         satisfactory condition to undergo the procedure. The                         anesthesia plan was to use monitored anesthesia care                         (MAC). Immediately prior to administration of                         medications, the patient was re-assessed for adequacy                         to receive sedatives. The heart rate, respiratory                         rate, oxygen saturations, blood pressure, adequacy of                         pulmonary ventilation, and response to care were                         monitored throughout the procedure. The physical                         status of the patient was re-assessed after the                         procedure.                        After obtaining informed consent, the colonoscope was                         passed under direct vision. Throughout the procedure,                         the patient's blood pressure, pulse, and oxygen                         saturations were monitored continuously. The  Colonoscope was introduced through the anus and                         advanced to the the terminal ileum, with                         identification of the appendiceal orifice and IC                         valve. The colonoscopy was performed without                         difficulty. The patient tolerated the procedure well.                         The quality of the bowel preparation was evaluated                         using the BBPS [facility]([place] Bowel Preparation Scale) with                         scores of: Right Colon = 3, Transverse Colon = 3 and                         Left Colon = 3 (entire mucosa seen well with no                          residual staining, small fragments of stool or opaque                         liquid). The total BBPS score equals 9. The terminal                         ileum, ileocecal valve, appendiceal orifice, and                         rectum were photographed. Findings:      The perianal and digital rectal examinations were normal. Pertinent       negatives include normal sphincter tone.      The terminal ileum appeared normal. Estimated blood loss: none.      Retroflexion in the right colon was performed.      Multiple small-mouthed diverticula were found in the left colon.       Estimated blood loss: none.      Non-bleeding internal hemorrhoids were found during retroflexion. The       hemorrhoids were Grade I (internal hemorrhoids that do not prolapse).       Estimated blood loss: none.      Seven sessile polyps were found in the sigmoid colon (2), descending       colon (3) and ascending colon (2). The polyps were 1 to 2 mm in size.       These polyps were removed with a jumbo cold forceps. Resection and       retrieval were complete. Estimated blood loss was minimal.      The exam was otherwise without abnormality on direct and retroflexion       views. Impression:            -  The examined portion of the ileum was normal.                        - Diverticulosis in the left colon.                        - Non-bleeding internal hemorrhoids.                        - Seven 1 to 2 mm polyps in the sigmoid colon, in the                         descending colon and in the ascending colon, removed                         with a jumbo cold forceps. Resected and retrieved.                        - The examination was otherwise normal on direct and                         retroflexion views. Recommendation:        - Patient has a contact number available for                         emergencies. The signs and symptoms of potential                         delayed complications were discussed with the  patient.                         Return to normal activities tomorrow. Written                         discharge instructions were provided to the patient.                        - Discharge patient to home.                        - Resume previous diet.                        - Continue present medications.                        - Await pathology results.                        - Repeat colonoscopy for surveillance based on                         pathology results.                        - Return to referring physician as previously                         scheduled.                        -  The findings and recommendations were discussed with                         the patient. Procedure Code(s):     --- Professional ---                        [phone], Colonoscopy, flexible; with biopsy, single or                         multiple Diagnosis Code(s):     --- Professional ---                        Z12.11, Encounter for screening for malignant neoplasm                         of colon                        K64.0, First degree hemorrhoids                        D12.5, Benign neoplasm of sigmoid colon                        D12.4, Benign neoplasm of descending colon                        D12.2, Benign neoplasm of ascending colon                        K57.30, Diverticulosis of large intestine without                         perforation or abscess without bleeding CPT copyright 2022 American Medical Association. All rights reserved. The codes documented in this report are preliminary and upon coder review may  be revised to meet current compliance requirements. Attending Participation:      I personally performed the entire procedure. Elfredia Nevins, DO Jaynie Collins DO, DO 05/17/2022 8:01:12 AM This report has been signed electronically. Number of Addenda: 0 Note Initiated On: 05/17/2022 7:30 AM Scope Withdrawal Time: 0 hours 18 minutes 48 seconds  Total Procedure Duration: 0  hours 22 minutes 15 seconds  Estimated Blood Loss:  Estimated blood loss was minimal.      [facility]

## 2022-05-17 NOTE — Anesthesia Postprocedure Evaluation (Signed)
Anesthesia Post Note  Patient: Wayne Hernandez  Procedure(s) Performed: COLONOSCOPY WITH PROPOFOL  Patient location during evaluation: PACU Anesthesia Type: General Level of consciousness: awake and oriented Pain management: satisfactory to patient Vital Signs Assessment: post-procedure vital signs reviewed and stable Respiratory status: spontaneous breathing and respiratory function stable Cardiovascular status: stable Anesthetic complications: no   There were no known notable events for this encounter.   Last Vitals:  Vitals:   05/17/22 0810 05/17/22 0820  BP: (!) 89/57 94/66  Pulse: 73 69  Resp: (!) 21 12  Temp:    SpO2: 95% 97%    Last Pain:  Vitals:   05/17/22 0820  TempSrc:   PainSc: 0-No pain                 VAN STAVEREN,Simmie Camerer

## 2022-05-17 NOTE — Transfer of Care (Signed)
Immediate Anesthesia Transfer of Care Note  Patient: Wayne Hernandez  Procedure(s) Performed: COLONOSCOPY WITH PROPOFOL  Patient Location: PACU  Anesthesia Type:General  Level of Consciousness: drowsy  Airway & Oxygen Therapy: Patient Spontanous Breathing and Patient connected to nasal cannula oxygen  Post-op Assessment: Report given to RN and Post -op Vital signs reviewed and stable  Post vital signs: stable  Last Vitals:  Vitals Value Taken Time  BP 93/57 05/17/22 0800  Temp 35.6 C 05/17/22 0800  Pulse 76 05/17/22 0802  Resp 22 05/17/22 0802  SpO2 96 % 05/17/22 0802  Vitals shown include unvalidated device data.  Last Pain:  Vitals:   05/17/22 0800  TempSrc: Temporal  PainSc: Asleep         Complications: No notable events documented.

## 2022-05-20 ENCOUNTER — Encounter: Payer: Self-pay | Admitting: Gastroenterology

## 2022-05-20 LAB — SURGICAL PATHOLOGY

## 2022-06-26 ENCOUNTER — Ambulatory Visit (INDEPENDENT_AMBULATORY_CARE_PROVIDER_SITE_OTHER): Payer: 59

## 2022-06-26 ENCOUNTER — Ambulatory Visit (INDEPENDENT_AMBULATORY_CARE_PROVIDER_SITE_OTHER): Payer: 59 | Admitting: Podiatry

## 2022-06-26 DIAGNOSIS — M7751 Other enthesopathy of right foot: Secondary | ICD-10-CM | POA: Diagnosis not present

## 2022-06-26 DIAGNOSIS — M7731 Calcaneal spur, right foot: Secondary | ICD-10-CM

## 2022-06-26 DIAGNOSIS — M7750 Other enthesopathy of unspecified foot: Secondary | ICD-10-CM

## 2022-06-26 NOTE — Progress Notes (Signed)
  Subjective:  Patient ID: Wayne Hernandez, male    DOB: 10-16-1970,  MRN: [id number]  Chief Complaint  Patient presents with   Tendonitis    right heel pain - new xrays today    52 y.o. male presents with the above complaint. History confirmed with patient.  He returns for follow-up he notes pain on the outside lateral posterior edge of the heel, worse towards end of day with work, wearing a copper fit sleeve that is helpful.  Objective:  Physical Exam: warm, good capillary refill, no trophic changes or ulcerative lesions, normal DP and PT pulses, normal sensory exam, and no palpable spurring, no edema or heat or pain to palpation, good 5 out of 5 strength.   Radiographs: Multiple views x-ray of : No significant change, no increase in posterior enthesophyte Assessment:   1. Heel spur, right      Plan:  Patient was evaluated and treated and all questions answered.  Reviewed his x-rays.  Discussed there has not been recurrence of the posterior heel spur.  His strength is appropriate and does not have direct pain to palpation.  Hopefully as early, advised to continue daily stretching exercises.  Utilize Voltaren topical gel 3-4 times daily as needed.  If not improving would recommend formal physical therapy.  Return if symptoms worsen or fail to improve.

## 2022-06-26 NOTE — Patient Instructions (Signed)
Look for Voltaren gel at the pharmacy over the counter or online (also known as diclofenac 1% gel). Apply to the painful areas 3-4x daily with the supplied dosing card. Allow to dry for 10 minutes before going into socks/shoes  

## 2022-08-14 ENCOUNTER — Ambulatory Visit: Payer: Self-pay | Admitting: Physician Assistant

## 2022-08-14 ENCOUNTER — Encounter: Payer: Self-pay | Admitting: Physician Assistant

## 2022-08-14 VITALS — BP 127/92 | HR 102 | Temp 97.8°F | Resp 16 | Ht 70.0 in | Wt 243.0 lb

## 2022-08-14 DIAGNOSIS — L03115 Cellulitis of right lower limb: Secondary | ICD-10-CM

## 2022-08-14 MED ORDER — MUPIROCIN 2 % EX OINT: 1.0000 | TOPICAL_OINTMENT | Freq: Two times a day (BID) | CUTANEOUS | 0 refills | Status: DC

## 2022-08-14 MED ORDER — CEFDINIR 300 MG PO CAPS
300.0000 mg | ORAL_CAPSULE | Freq: Two times a day (BID) | ORAL | 0 refills | Status: DC
Start: 2022-08-14 — End: 2022-08-26

## 2022-08-14 NOTE — Progress Notes (Signed)
   Subjective: Infection right posterior heel    Patient ID: Wayne Hernandez, male    DOB: [birth year], 52 y.o.   MRN: [id number]  HPI Patient presents with edema erythema and purulent drainage posterior right heel.  States noticed a papular lesion to the posterior heel and tried to express fluid from the lesion.  Denies pain or decreased range of motion.  Patient states he wears support stockings and denied of any other provocative incident for complaint.  Patient states surgery in 2021 t for debridement Achilles tendon, resection of a heel spur on the posterior ankle, and Haglund's  resection.  Patient states status post procedure he had 2 incidents of infection to the posterior heel.  Patient said he recovered by 2022 with no sequela.   Review of Systems Diabetes, hyperlipidemia, and hypertension    Objective:   Physical Exam  No acute distress.  Examination of posterior right ankle reveals a ruptured papular lesion at the insertion point of the Achilles.  Edema and erythema is apparent.  No obvious drainage at this time.  No bleeding.       Assessment & Plan: Cellulitis right posterior ankle  Area is clean and rebandaged.  Patient given a prescription for Omnicef and Bactroban.  Patient placed on a no work status to return back on 08/19/2022 for reevaluation.

## 2022-08-14 NOTE — Progress Notes (Signed)
Possible infection on back of right heel x 1 day.

## 2022-08-19 ENCOUNTER — Ambulatory Visit: Payer: Self-pay | Admitting: Physician Assistant

## 2022-08-19 ENCOUNTER — Encounter: Payer: Self-pay | Admitting: Physician Assistant

## 2022-08-19 VITALS — BP 130/84 | HR 115 | Temp 98.9°F | Resp 14

## 2022-08-19 DIAGNOSIS — L02611 Cutaneous abscess of right foot: Secondary | ICD-10-CM

## 2022-08-19 NOTE — Progress Notes (Signed)
Notes faxed to [facility] and also referral via Epic noted.  Since pt is a current pt of [facility], he stated he will call today for urgent appt to eval right heel wound.

## 2022-08-19 NOTE — Addendum Note (Signed)
Addended by: Hansel Feinstein on: 08/19/2022 08:56 AM   Modules accepted: Orders

## 2022-08-19 NOTE — Progress Notes (Addendum)
Con't ABT from last week visit with right heel wound appearance small with small amount serosanguinous drainage noted on bandaid he applied.  Stated no fever.  Note upon assessment of right heel wound that wound bed spongy with depth when touched with qtip.  Not assessed for undermining with complaint of pain.  Stated had xrays several months ago at [facility] who did his Achilles repair several years ago.

## 2022-08-19 NOTE — Progress Notes (Addendum)
   Subjective:abscess right heel.    Patient ID: Wayne Hernandez, male    DOB: 1970-11-03, 52 y.o.   MRN: [id number]  HPI Patient is follow-up for right heel ulcer.  Patient was seen 5 days ago and given a prescription for Omnicef and Bactroban.  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  Patient states this presentation is the same.  Patient requesting consult to podiatry.   Review of Systems Diabetes and polycythemia    Objective:   Physical Exam BP 130/84  Pulse 115  Resp 14  Temp 98.9 F (37.2 C)  SpO2 97 %  Patient no acute distress.  Examination of posterior heel revealed a 2 to 3 mm ulcer with a small amount of purulent drainage.  There is mild erythema.  Neurovascular intact.  Free and equal range of motion.     Assessment & Plan: Abscess right posterior heel  Advised to continue oral and topical antibiotics pending evaluation by podiatry.

## 2022-08-20 ENCOUNTER — Ambulatory Visit (INDEPENDENT_AMBULATORY_CARE_PROVIDER_SITE_OTHER): Payer: 59 | Admitting: Podiatry

## 2022-08-20 DIAGNOSIS — L97411 Non-pressure chronic ulcer of right heel and midfoot limited to breakdown of skin: Secondary | ICD-10-CM | POA: Diagnosis not present

## 2022-08-20 NOTE — Progress Notes (Signed)
  Subjective:  Patient ID: Wayne Hernandez, male    DOB: 19-Sep-1970,  MRN: [id number]  Chief Complaint  Patient presents with   Foot Pain    Right heel wound     52 y.o. male presents with the above complaint.  Patient presents with right heel superficial ulceration limited to the breakdown of the skin.  He just wanted get it evaluated patient is known to Dr. Abbott Pao.  He has not seen MRIs prior to seeing me he is a diabetic with elevated A1c.  He states that he is tried some ill fitting shoes that may have caused it.  Since then he is thrown of the shoes on the left side is improving considerably   Review of Systems: Negative except as noted in the HPI. Denies N/V/F/Ch.  Past Medical History:  Diagnosis Date   Diabetes (HCC)    Diverticulitis    Elevated hemoglobin A1c    Elevated uric acid in blood    Esophagitis    Hypertension    Low HDL (under 40)    Overweight     Current Outpatient Medications:    aspirin EC 81 MG tablet, Take 81 mg by mouth daily., Disp: , Rfl:    empagliflozin (JARDIANCE) 10 MG TABS tablet, Take 1 tablet (10 mg total) by mouth daily before breakfast., Disp: 90 tablet, Rfl: 3   lisinopril (ZESTRIL) 10 MG tablet, Take 1 tablet (10 mg total) by mouth daily., Disp: 90 tablet, Rfl: 3   metFORMIN (GLUCOPHAGE) 1000 MG tablet, Take 1 tablet (1,000 mg total) by mouth 2 (two) times daily with a meal., Disp: 180 tablet, Rfl: 3   mupirocin ointment (BACTROBAN) 2 %, Apply 1 Application topically 2 (two) times daily., Disp: 15 g, Rfl: 0   rosuvastatin (CRESTOR) 40 MG tablet, Take 1 tablet (40 mg total) by mouth daily., Disp: 90 tablet, Rfl: 3  Social History   Tobacco Use  Smoking Status Never  Smokeless Tobacco Never    No Known Allergies Objective:  There were no vitals filed for this visit. There is no height or weight on file to calculate BMI. Constitutional Well developed. Well nourished.  Vascular Dorsalis pedis pulses palpable bilaterally. Posterior  tibial pulses palpable bilaterally. Capillary refill normal to all digits.  No cyanosis or clubbing noted. Pedal hair growth normal.  Neurologic Normal speech. Oriented to person, place, and time. Epicritic sensation to light touch grossly present bilaterally.  Dermatologic Right heel ulcer limited to the breakdown of skin no purulent drainage noted no erythema noted no signs of infection noted.  Does not probe to deep tissue  Orthopedic: Normal joint ROM without pain or crepitus bilaterally. No visible deformities. No bony tenderness.   Radiographs: None Assessment:   1. Chronic heel ulcer, right, limited to breakdown of skin [facility])    Plan:  Patient was evaluated and treated and all questions answered.  Right heel superficial ulceration -All questions and concerns were discussed with the patient in extensive detail.  Given that this is very superficial will benefit from shoe gear modification which was discussed in extensive detail as well as triple antibiotic and a Band-Aid daily changes until resolved by I discussed this with him he states understand will do so immediately  No follow-ups on file.  Right heel superficial wound likely from shoe rubbing/ill fitting shoes.  Mupirocin ointment and Band-Aid

## 2022-08-22 ENCOUNTER — Ambulatory Visit: Payer: Self-pay | Admitting: Physician Assistant

## 2022-08-22 ENCOUNTER — Encounter: Payer: Self-pay | Admitting: Physician Assistant

## 2022-08-22 DIAGNOSIS — L02611 Cutaneous abscess of right foot: Secondary | ICD-10-CM

## 2022-08-22 NOTE — Progress Notes (Signed)
   Subjective: Right heel abscess    Patient ID: Wayne Hernandez, male    DOB: December 16, 1970, 52 y.o.   MRN: [id number]  HPI Patient follow-up from yesterday podiatry appointment for evaluation of right posterior heel abscess.  Patient states that advised him to continue previous medication from his PCP and recommended a 3-week work excuse.  Patient states wound is healing well and he feels he does not need to be off for 3 weeks.  Do not have notes from podiatry at this time. Review of Systems Diabetes, hyperlipidemia, and hypertension.    Objective:   Physical Exam Vital signs not taken. Examination posterior heel shows reduced erythema.  Wound is closed.  Neurovascular intact with free and equal range of motion.       Assessment & Plan: Healing abscess right posterior heel  Discussed with patient that we will continue him on a no work status until 08/26/2022.  Anticipate return back to full duty at that time.  Advised to continue and finish previous medication for this complaint.

## 2022-08-22 NOTE — Progress Notes (Signed)
Pt presents today to follow up on infection of right heel. Pt stating he's waiting for notes from Dr. Allena Katz.

## 2022-08-26 ENCOUNTER — Encounter: Payer: Self-pay | Admitting: Physician Assistant

## 2022-08-26 ENCOUNTER — Ambulatory Visit: Payer: Self-pay | Admitting: Physician Assistant

## 2022-08-26 VITALS — BP 130/88 | HR 99 | Temp 96.9°F | Resp 16

## 2022-08-26 DIAGNOSIS — L02611 Cutaneous abscess of right foot: Secondary | ICD-10-CM

## 2022-08-26 NOTE — Progress Notes (Signed)
   Subjective: Right heel abscess    Patient ID: Wayne Hernandez, male    DOB: 06-Feb-1971, 52 y.o.   MRN: [id number]  HPI Patient is follow-up for right heel abscess which initially seen on 08/14/2022.  Patient was placed on topical and oral antibiotics.  Patient also states saw his podiatrist secondary to this being a recurrent complaint.  Patient wound has completely healed and he has finished antibiotics.  Request return back to full duty.   Review of Systems Negative septa above complaint    Objective:   Physical Exam BP 130/88  Pulse 99  Resp 16  Temp 96.9 F (36.1 C)  SpO2 96 %   Examination today shows wound is completely healed.  No edema or erythema.     Assessment & Plan: Right heel abscess  Advised patient to keep the area padded due to the concerns of friction from his work shoes or tennis shoes may aggravating and caused his condition.  Patient return back to full duties.

## 2022-08-26 NOTE — Progress Notes (Signed)
Right heel area appears closed and finished PO antibiotics.  He is keeping that area covered to protect friction on shoes.  No redness noted to right heel wound area.

## 2022-09-11 ENCOUNTER — Encounter: Payer: Self-pay | Admitting: Podiatry

## 2022-09-11 ENCOUNTER — Ambulatory Visit (INDEPENDENT_AMBULATORY_CARE_PROVIDER_SITE_OTHER): Payer: 59 | Admitting: Podiatry

## 2022-09-11 ENCOUNTER — Other Ambulatory Visit: Payer: Self-pay | Admitting: Podiatry

## 2022-09-11 ENCOUNTER — Ambulatory Visit (INDEPENDENT_AMBULATORY_CARE_PROVIDER_SITE_OTHER): Payer: 59

## 2022-09-11 DIAGNOSIS — L97411 Non-pressure chronic ulcer of right heel and midfoot limited to breakdown of skin: Secondary | ICD-10-CM

## 2022-09-11 MED ORDER — GENTAMICIN SULFATE 0.1 % EX OINT: 1.0000 | TOPICAL_OINTMENT | Freq: Every day | CUTANEOUS | 0 refills | Status: DC

## 2022-09-11 MED ORDER — CIPROFLOXACIN HCL 500 MG PO TABS: 500.0000 mg | ORAL_TABLET | Freq: Two times a day (BID) | ORAL | 0 refills | Status: AC

## 2022-09-11 NOTE — Progress Notes (Signed)
  Subjective:  Patient ID: Wayne Hernandez, male    DOB: 11-13-1970,  MRN: [id number]  Chief Complaint  Patient presents with   Wound Check    "It's still leaking."    52 y.o. male presents with the above complaint. History confirmed with patient.  He returns for follow-up early last month he developed blistering swelling and irritation around the back of the heel, he saw Dr. Allena Katz who recommend he continue the mupirocin ointment.  He is not sure this is being effective anymore because it was left in his car during the hot weather.  Has not gotten much bigger  Objective:  Physical Exam: warm, good capillary refill, no trophic changes or ulcerative lesions, normal DP and PT pulses, normal sensory exam, and right heel small 5 mm circular granular wound with serous drainage, periwound maceration, no pain to palpation of heel or tendon.   Radiographs: Multiple views x-ray of the right foot: New radiographs taken today show no overt signs of infection, tract from the anchors still present and consistent with previous films Assessment:   1. Chronic heel ulcer, right, limited to breakdown of skin [facility])      Plan:  Patient was evaluated and treated and all questions answered.  Reviewed his x-rays today discussed that likely due to blistering from previous shoe gear, he does have a history of Pseudomonas infection and could be contaminating this wound as well and has not had proper antibiotic therapy so I did prescribe him ciprofloxacin for 7 days.  Rx for gentamicin ointment sent to pharmacy as well discontinue mupirocin and use this.  Culture of the wound was taken to assess for any underlying infection.  We also discussed possibility that dissolving anchors could be causing some fluid buildup but there are no overt signs of infection deep to this.  I will see him back in 4 weeks to reevaluate.  Okay to continue regular activity and travel  Return in about 4 weeks (around 10/09/2022) for f/u on  heel abscess.

## 2022-09-12 ENCOUNTER — Other Ambulatory Visit: Payer: Self-pay

## 2022-09-12 DIAGNOSIS — Z1152 Encounter for screening for COVID-19: Secondary | ICD-10-CM

## 2022-09-12 LAB — POC COVID19 BINAXNOW: SARS Coronavirus 2 Ag: POSITIVE — AB

## 2022-09-12 NOTE — Progress Notes (Signed)
Stated several days of sinus complaints with fever x 24 hours.  Tested for covid and positive result reported to pt and provider.  Pt declined provider visit and will take OTC meds and monitor and call if provider visit needed.  Denies dyspnea and taking PO well.

## 2022-09-30 ENCOUNTER — Other Ambulatory Visit: Payer: Self-pay | Admitting: Physician Assistant

## 2022-09-30 DIAGNOSIS — I10 Essential (primary) hypertension: Secondary | ICD-10-CM

## 2022-10-03 ENCOUNTER — Other Ambulatory Visit: Payer: Self-pay

## 2022-10-03 DIAGNOSIS — I10 Essential (primary) hypertension: Secondary | ICD-10-CM

## 2022-10-03 MED ORDER — LISINOPRIL 10 MG PO TABS
10.0000 mg | ORAL_TABLET | Freq: Every day | ORAL | 3 refills | Status: DC
Start: 2022-10-03 — End: 2023-09-22

## 2022-10-05 ENCOUNTER — Other Ambulatory Visit: Payer: Self-pay | Admitting: Physician Assistant

## 2022-10-05 DIAGNOSIS — E0800 Diabetes mellitus due to underlying condition with hyperosmolarity without nonketotic hyperglycemic-hyperosmolar coma (NKHHC): Secondary | ICD-10-CM

## 2022-10-09 ENCOUNTER — Encounter: Payer: Self-pay | Admitting: Podiatry

## 2022-10-09 ENCOUNTER — Ambulatory Visit (INDEPENDENT_AMBULATORY_CARE_PROVIDER_SITE_OTHER): Payer: 59 | Admitting: Podiatry

## 2022-10-09 ENCOUNTER — Other Ambulatory Visit: Payer: Self-pay

## 2022-10-09 DIAGNOSIS — E0800 Diabetes mellitus due to underlying condition with hyperosmolarity without nonketotic hyperglycemic-hyperosmolar coma (NKHHC): Secondary | ICD-10-CM

## 2022-10-09 DIAGNOSIS — L97411 Non-pressure chronic ulcer of right heel and midfoot limited to breakdown of skin: Secondary | ICD-10-CM

## 2022-10-09 DIAGNOSIS — L988 Other specified disorders of the skin and subcutaneous tissue: Secondary | ICD-10-CM | POA: Diagnosis not present

## 2022-10-09 MED ORDER — METFORMIN HCL 1000 MG PO TABS
1000.0000 mg | ORAL_TABLET | Freq: Two times a day (BID) | ORAL | 3 refills | Status: DC
Start: 2022-10-09 — End: 2023-10-14

## 2022-10-10 NOTE — Progress Notes (Signed)
  Subjective:  Patient ID: Wayne Hernandez, male    DOB: [birth year],  MRN: [id number]  Chief Complaint  Patient presents with   Wound Check    "I think it's doing okay.  I'm still putting the ointment on it.  This is the best it's looked."    52 y.o. male presents with the above complaint. History confirmed with patient.  He returns for follow-up he completed the Cipro and is using the gentamicin  Objective:  Physical Exam: warm, good capillary refill, no trophic changes or ulcerative lesions, normal DP and PT pulses, normal sensory exam, and right heel small 5 mm circular granular wound with seropurulent drainage on pressure and expression, unchanged in appearance, some tenderness  Culture with skin flora  Radiographs: Multiple views x-ray of the right foot: New radiographs taken today show no overt signs of infection, tract from the anchors still present and consistent with previous films Assessment:   1. Chronic heel ulcer, right, limited to breakdown of skin (HCC)   2. Draining cutaneous sinus tract      Plan:  Patient was evaluated and treated and all questions answered.  Still dealing with a draining skin cutaneous sinus tract.  Certainly there is some concern for abscess or deeper infection although his wound culture of the area was negative.  Suspect some of this could be overlying a suture knot or anchor as well.  I recommend an MRI to evaluate.  This has been ordered I will see him back after the study for further treatment options.  Return for after MRI to review.

## 2022-10-21 ENCOUNTER — Other Ambulatory Visit: Payer: Self-pay | Admitting: Physician Assistant

## 2022-10-21 DIAGNOSIS — E782 Mixed hyperlipidemia: Secondary | ICD-10-CM

## 2022-10-22 ENCOUNTER — Encounter: Payer: Self-pay | Admitting: Podiatry

## 2022-10-25 ENCOUNTER — Encounter: Payer: Self-pay | Admitting: Podiatry

## 2022-10-29 ENCOUNTER — Ambulatory Visit
Admission: RE | Admit: 2022-10-29 | Discharge: 2022-10-29 | Disposition: A | Discharge status: Home / Self Care | Payer: 59 | Source: Ambulatory Visit | Attending: Podiatry | Admitting: Podiatry

## 2022-10-29 DIAGNOSIS — L988 Other specified disorders of the skin and subcutaneous tissue: Secondary | ICD-10-CM

## 2022-10-29 DIAGNOSIS — L97519 Non-pressure chronic ulcer of other part of right foot with unspecified severity: Secondary | ICD-10-CM | POA: Diagnosis not present

## 2022-10-29 DIAGNOSIS — L97411 Non-pressure chronic ulcer of right heel and midfoot limited to breakdown of skin: Secondary | ICD-10-CM

## 2022-10-29 MED ORDER — GADOPICLENOL 0.5 MMOL/ML IV SOLN
10.0000 mL | Freq: Once | INTRAVENOUS | Status: AC | PRN
  Administered 2022-10-29: 10 mL via INTRAVENOUS

## 2022-11-18 ENCOUNTER — Ambulatory Visit: Payer: 59 | Admitting: Podiatry

## 2022-11-20 ENCOUNTER — Encounter: Payer: Self-pay | Admitting: Podiatry

## 2022-11-20 ENCOUNTER — Telehealth: Payer: Self-pay | Admitting: Podiatry

## 2022-11-20 ENCOUNTER — Ambulatory Visit (INDEPENDENT_AMBULATORY_CARE_PROVIDER_SITE_OTHER): Payer: 59 | Admitting: Podiatry

## 2022-11-20 VITALS — Ht 70.0 in | Wt 243.0 lb

## 2022-11-20 DIAGNOSIS — T847XXA Infection and inflammatory reaction due to other internal orthopedic prosthetic devices, implants and grafts, initial encounter: Secondary | ICD-10-CM

## 2022-11-20 DIAGNOSIS — L988 Other specified disorders of the skin and subcutaneous tissue: Secondary | ICD-10-CM

## 2022-11-20 NOTE — Progress Notes (Signed)
Subjective:  Patient ID: Wayne Hernandez, male    DOB: Jun 23, 1970,  MRN: [id number]  Chief Complaint  Patient presents with   Foot Pain    RM1: Patient is here for MRI results    52 y.o. male presents with the above complaint. History confirmed with patient.  He returns for follow-up he has completed the MRI he has noted no change in the wound appearance feels well denies fever chills nausea vomiting  Objective:  Physical Exam: warm, good capillary refill, no trophic changes or ulcerative lesions, normal DP and PT pulses, normal sensory exam, and right heel small 5 mm circular granular wound with no active drainage today unchanged in appearance no pain  Culture with skin flora  Radiographs: Multiple views x-ray of the right foot: New radiographs taken today show no overt signs of infection, tract from the anchors still present and consistent with previous films   CLINICAL DATA:  Draining sinus tract of posterior heel   EXAM: MR OF THE RIGHT HEEL WITHOUT AND WITH CONTRAST   TECHNIQUE: Multiplanar, multisequence MR imaging of the right hindfoot was performed both before and after administration of intravenous contrast.   CONTRAST:  10 mL Vueway IV contrast   COMPARISON:  X-ray 09/11/2022, MRI 05/02/2020   FINDINGS: Bones/Joint/Cartilage   Redemonstration of postsurgical changes to the posterior calcaneus. Enhancing fluid is seen within the tendon anchor tracks within the posterior calcaneus. There is mild surrounding bone marrow edema. No overt bone destruction or marrow replacement. No fracture or dislocation. No joint effusion of the ankle or midfoot.   Ligaments   Intact medial and lateral ankle ligaments.   Muscles and Tendons   Chronically thickened distal Achilles tendon. There are sinus tracts coursing through the distal Achilles tendon contiguous with tendon anchor sites and overlying posterior heel wound/ulceration (series 104, and images 18-22). Remaining  tendinous structures of the ankle are within normal limits. No tenosynovitis.   Soft tissues   Small wound or ulceration at the posterior heel. Mild adjacent soft tissue edema. No organized or drainable fluid collections.   IMPRESSION: Small wound or ulceration at the posterior heel with sinus tracts coursing through the distal Achilles tendon contiguous with tendon anchor sites. Enhancing fluid is seen within the tendon anchor tracts within the posterior calcaneus with mild surrounding bone marrow edema. Given history of purulent drainage, early acute osteomyelitis is suspected. Correlate with serum inflammatory markers.     Electronically Signed   By: Duanne Guess D.O.   On: 10/29/2022 17:32 Assessment:   No diagnosis found.    Plan:  Patient was evaluated and treated and all questions answered.  We reviewed the results of his MRI.  I discussed with him that I suspect likely this was not true deep infection that has developed this far out from his surgery in 2021 I discussed with him that I suspect this likely is representative of resorption of the bioactive PLLA anchors creating a small seroma and sinus tract erupting through the wound.  I discussed with him that I would recommend removal of the deep implants and biopsy and culture of the surrounding tissue and bone.  We discussed long-term may need PICC line and IV antibiotics if this is concerning for osteomyelitis on biopsy.  All questions addressed.  Risks and alternatives of surgery were discussed.  Informed consent signed and reviewed.  Surgery scheduled for this coming Monday.    Surgical plan:  Procedure: -Removal of right Achilles anchors and suture, bone biopsy and culture  Location: -[facility]   Anesthesia plan: -IV sedation with regional block  Postoperative pain plan: - Tylenol 1000 mg every 6 hours, ibuprofen 600 mg every 6 hours, gabapentin 300 mg every 8 hours x5 days, oxycodone 5 mg 1-2 tabs every 6  hours only as needed  DVT prophylaxis: -None required  WB Restrictions / DME needs: -WBAT in CAM boot postop he will bring with him to surgery   No follow-ups on file.

## 2022-11-20 NOTE — Telephone Encounter (Signed)
DOS- 11/25/2022  REMOVAL FIXATION DEEP KWIRE/ SCREW RT-20680 BONE BIOPSY-20240  [employer] EFFECTIVE DATE- 08/12/2018  DEDUCTIBLE- $500.00 WITH REMAINING $0.00 OOP- $2000.00 WITH REMAINING $1,124.72  COINSURANCE- 10%  SPOKE WITH NICK G FROM [employer] AND HE STATED THAT PRIOR AUTHORIZATION IS NOT REQUIRED FOR CPT CODES 57846 AND 20240.  CALL REFERENCE NUMBER: [id number]

## 2022-11-25 ENCOUNTER — Other Ambulatory Visit: Payer: Self-pay | Admitting: Podiatry

## 2022-11-25 DIAGNOSIS — T85898A Other specified complication of other internal prosthetic devices, implants and grafts, initial encounter: Secondary | ICD-10-CM | POA: Diagnosis not present

## 2022-11-25 DIAGNOSIS — Z4889 Encounter for other specified surgical aftercare: Secondary | ICD-10-CM | POA: Diagnosis not present

## 2022-11-25 DIAGNOSIS — G8918 Other acute postprocedural pain: Secondary | ICD-10-CM | POA: Diagnosis not present

## 2022-11-25 DIAGNOSIS — M86471 Chronic osteomyelitis with draining sinus, right ankle and foot: Secondary | ICD-10-CM | POA: Diagnosis not present

## 2022-11-25 DIAGNOSIS — T847XXA Infection and inflammatory reaction due to other internal orthopedic prosthetic devices, implants and grafts, initial encounter: Secondary | ICD-10-CM | POA: Diagnosis not present

## 2022-11-25 DIAGNOSIS — M86671 Other chronic osteomyelitis, right ankle and foot: Secondary | ICD-10-CM | POA: Diagnosis not present

## 2022-11-25 MED ORDER — ACETAMINOPHEN 500 MG PO TABS: 1000.0000 mg | ORAL_TABLET | Freq: Four times a day (QID) | ORAL | 0 refills | Status: AC | PRN

## 2022-11-25 MED ORDER — CIPROFLOXACIN HCL 500 MG PO TABS: 500.0000 mg | ORAL_TABLET | Freq: Two times a day (BID) | ORAL | 0 refills | Status: DC

## 2022-11-25 MED ORDER — TRAMADOL HCL 50 MG PO TABS: 50.0000 mg | ORAL_TABLET | Freq: Four times a day (QID) | ORAL | 0 refills | Status: AC | PRN

## 2022-11-25 MED ORDER — IBUPROFEN 600 MG PO TABS: 600.0000 mg | ORAL_TABLET | Freq: Four times a day (QID) | ORAL | 0 refills | Status: AC | PRN

## 2022-11-25 NOTE — Progress Notes (Signed)
10/14 I&D R heel and removal of anchors

## 2022-12-02 ENCOUNTER — Ambulatory Visit (INDEPENDENT_AMBULATORY_CARE_PROVIDER_SITE_OTHER): Payer: 59 | Admitting: Podiatry

## 2022-12-02 ENCOUNTER — Encounter: Payer: Self-pay | Admitting: Podiatry

## 2022-12-02 ENCOUNTER — Ambulatory Visit (INDEPENDENT_AMBULATORY_CARE_PROVIDER_SITE_OTHER): Payer: 59

## 2022-12-02 VITALS — Ht 70.0 in | Wt 243.0 lb

## 2022-12-02 DIAGNOSIS — Z9889 Other specified postprocedural states: Secondary | ICD-10-CM | POA: Diagnosis not present

## 2022-12-02 DIAGNOSIS — M86471 Chronic osteomyelitis with draining sinus, right ankle and foot: Secondary | ICD-10-CM

## 2022-12-02 NOTE — Patient Instructions (Addendum)
Referral information:  Dr Lynn Ito [facility] [address] [place], [place] [address] [phone]

## 2022-12-03 NOTE — Progress Notes (Signed)
  Subjective:  Patient ID: Wayne Hernandez, male    DOB: 10-16-1970,  MRN: [id number]  Chief Complaint  Patient presents with   Routine Post Op    RM24: POV # 1 DOS 11/25/2022 ---RIGHT HEEL REMOVAL OF ANCHORS, BONE BIOPSY AND CULTURE/ pt having drainage from surgical site       52 y.o. male returns for post-op check.  Noted some bleeding in the bandage  Review of Systems: Negative except as noted in the HPI. Denies N/V/F/Ch.   Objective:  There were no vitals filed for this visit. Body mass index is 34.87 kg/m. Constitutional Well developed. Well nourished.  Vascular Foot warm and well perfused. Capillary refill normal to all digits.  Calf is soft and supple, no posterior calf or knee pain, negative Homans' sign  Neurologic Normal speech. Oriented to person, place, and time. Epicritic sensation to light touch grossly present bilaterally.  Dermatologic His incision is healing well.  There is a small slight bleeding in the central portion.  No active purulent drainage  Orthopedic: Tenderness to palpation noted about the surgical site.        Multiple view plain film radiographs: No significant change or abnormality some bony debris in the posterior heel Assessment:   1. Post-operative state   2. 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  S/p foot surgery right -Progressing as expected post-operatively.  His cultures have growth of Pseudomonas and his path report is positive for chronic osteomyelitis.  I have placed a referral to Dr. Rivka Safer with infectious diseases, expect he will need antibiotic treatment for his osteomyelitis -WB Status: WBAT in CAM boot -Sutures: We will remove in 10 days. -Medications: He will continue his ciprofloxacin -Foot redressed.  Return in about 9 days (around 12/11/2022) for suture removal, post op (new x-rays).

## 2022-12-05 ENCOUNTER — Encounter: Payer: 59 | Admitting: Podiatry

## 2022-12-05 ENCOUNTER — Telehealth: Payer: Self-pay

## 2022-12-05 ENCOUNTER — Other Ambulatory Visit
Admission: RE | Admit: 2022-12-05 | Discharge: 2022-12-05 | Disposition: A | Discharge status: Home / Self Care | Payer: 59 | Source: Ambulatory Visit | Attending: Infectious Diseases | Admitting: Infectious Diseases

## 2022-12-05 ENCOUNTER — Encounter: Payer: Self-pay | Admitting: Infectious Diseases

## 2022-12-05 ENCOUNTER — Ambulatory Visit: Payer: 59 | Attending: Podiatry | Admitting: Infectious Diseases

## 2022-12-05 VITALS — BP 150/88 | HR 100 | Temp 97.1°F

## 2022-12-05 DIAGNOSIS — M86471 Chronic osteomyelitis with draining sinus, right ankle and foot: Secondary | ICD-10-CM | POA: Diagnosis not present

## 2022-12-05 DIAGNOSIS — B965 Pseudomonas (aeruginosa) (mallei) (pseudomallei) as the cause of diseases classified elsewhere: Secondary | ICD-10-CM | POA: Insufficient documentation

## 2022-12-05 DIAGNOSIS — R7303 Prediabetes: Secondary | ICD-10-CM | POA: Diagnosis not present

## 2022-12-05 DIAGNOSIS — T8149XD Infection following a procedure, other surgical site, subsequent encounter: Secondary | ICD-10-CM | POA: Diagnosis not present

## 2022-12-05 DIAGNOSIS — M86671 Other chronic osteomyelitis, right ankle and foot: Secondary | ICD-10-CM

## 2022-12-05 DIAGNOSIS — I10 Essential (primary) hypertension: Secondary | ICD-10-CM | POA: Diagnosis not present

## 2022-12-05 DIAGNOSIS — E1169 Type 2 diabetes mellitus with other specified complication: Secondary | ICD-10-CM

## 2022-12-05 DIAGNOSIS — Z79899 Other long term (current) drug therapy: Secondary | ICD-10-CM | POA: Diagnosis not present

## 2022-12-05 DIAGNOSIS — Z7984 Long term (current) use of oral hypoglycemic drugs: Secondary | ICD-10-CM | POA: Insufficient documentation

## 2022-12-05 DIAGNOSIS — D751 Secondary polycythemia: Secondary | ICD-10-CM | POA: Insufficient documentation

## 2022-12-05 LAB — CBC WITH DIFFERENTIAL/PLATELET
Abs Immature Granulocytes: 0.04 10*3/uL (ref 0.00–0.07)
Basophils Absolute: 0.1 10*3/uL (ref 0.0–0.1)
Basophils Relative: 1 %
Eosinophils Absolute: 0.3 10*3/uL (ref 0.0–0.5)
Eosinophils Relative: 3 %
HCT: 56.7 % — ABNORMAL HIGH (ref 39.0–52.0)
Hemoglobin: 18.6 g/dL — ABNORMAL HIGH (ref 13.0–17.0)
Immature Granulocytes: 0 %
Lymphocytes Relative: 13 %
Lymphs Abs: 1.4 10*3/uL (ref 0.7–4.0)
MCH: 26.8 pg (ref 26.0–34.0)
MCHC: 32.8 g/dL (ref 30.0–36.0)
MCV: 81.8 fL (ref 80.0–100.0)
Monocytes Absolute: 0.7 10*3/uL (ref 0.1–1.0)
Monocytes Relative: 7 %
Neutro Abs: 7.7 10*3/uL (ref 1.7–7.7)
Neutrophils Relative %: 76 %
Platelets: 365 10*3/uL (ref 150–400)
RBC: 6.93 MIL/uL — ABNORMAL HIGH (ref 4.22–5.81)
RDW: 14 % (ref 11.5–15.5)
WBC: 10.2 10*3/uL (ref 4.0–10.5)
nRBC: 0 % (ref 0.0–0.2)

## 2022-12-05 LAB — COMPREHENSIVE METABOLIC PANEL
ALT: 22 U/L (ref 0–44)
AST: 27 U/L (ref 15–41)
Albumin: 4.1 g/dL (ref 3.5–5.0)
Alkaline Phosphatase: 99 U/L (ref 38–126)
Anion gap: 12 (ref 5–15)
BUN: 17 mg/dL (ref 6–20)
CO2: 22 mmol/L (ref 22–32)
Calcium: 9.2 mg/dL (ref 8.9–10.3)
Chloride: 100 mmol/L (ref 98–111)
Creatinine, Ser: 0.9 mg/dL (ref 0.61–1.24)
GFR, Estimated: >60 mL/min (ref >60)
Glucose, Bld: 111 mg/dL — ABNORMAL HIGH (ref 70–99)
Potassium: 4 mmol/L (ref 3.5–5.1)
Sodium: 134 mmol/L — ABNORMAL LOW (ref 135–145)
Total Bilirubin: 0.7 mg/dL (ref 0.3–1.2)
Total Protein: 8.3 g/dL — ABNORMAL HIGH (ref 6.5–8.1)

## 2022-12-05 LAB — C-REACTIVE PROTEIN: CRP: 0.9 mg/dL (ref <1.0)

## 2022-12-05 LAB — SEDIMENTATION RATE: Sed Rate: 1 mm/h (ref 0–20)

## 2022-12-05 MED ORDER — CIPROFLOXACIN HCL 500 MG PO TABS: 500.0000 mg | ORAL_TABLET | Freq: Two times a day (BID) | ORAL | 1 refills | Status: AC

## 2022-12-05 NOTE — Telephone Encounter (Signed)
-----   Message from Lynn Ito sent at 12/05/2022  1:05 PM EDT ----- Please let him know that his labs look good except Hb which is high as he has secondary polycythemia ----- Message ----- From: Leory Plowman, [facility] [place] Sent: 12/05/2022  11:56 AM EDT To: Lynn Ito, MD

## 2022-12-05 NOTE — Patient Instructions (Signed)
You visited [place] today due to a wound in your rt foot that started on July 4th. You have a history of bone spur surgery and borderline diabetes. Recently, you had another surgery to remove anchors from a previous surgery site due to irritation and infection. The culture was pseudomonas .You are currently on Ciprofloxacin and have shown improvement. We also reviewed your diabetes, hypertension, and hyperlipidemia management.  YOUR PLAN:  -POSTOPERATIVE WOUND INFECTION: You have an infection at the site of your recent surgery to remove anchors from a previous Achilles tendon repair. This infection is caused by a bacteria called Pseudomonas. Continue taking Ciprofloxacin for an additional 2 weeks, monitor for any signs of tendon pain or weakness, and check your blood glucose levels regularly. Return to your surgeon next week for suture removal.  -DIABETES MELLITUS: You have borderline diabetes, which means your blood sugar levels are higher than normal but not high enough to be classified as diabetes. Continue taking your current medications, Metformin and Jardiance, and monitor your blood glucose levels regularly, especially while on Ciprofloxacin.  -HYPERTENSION: You have high blood pressure, which is being managed with Lisinopril. Your blood pressure was slightly elevated during today's visit, possibly due to physical exertion. Continue taking Lisinopril and monitor your blood pressure regularly.  -HYPERLIPIDEMIA: You have high cholesterol levels, which are being managed with Rosuvastatin. Continue taking Rosuvastatin as prescribed.  -GENERAL HEALTH MAINTENANCE: For your overall health, continue taking daily Aspirin for cardiovascular protection. You are planning to return to work next week. Consider using compression socks for comfort and support.  INSTRUCTIONS:  Continue Ciprofloxacin for an additional 2 weeks. Return to your surgeon next week for suture removal. Monitor your blood glucose levels  regularly, especially while on Ciprofloxacin. Monitor your blood pressure regularly.

## 2022-12-05 NOTE — Telephone Encounter (Signed)
Patient informed of lab results and verbalized understanding. Londen Bok Jonathon Resides, CMA

## 2022-12-05 NOTE — Progress Notes (Signed)
NAME: Wayne Hernandez  DOB: [birth year]  MRN: [id number]  Date/Time: 12/05/2022 10:38 AM  Here with his mother Discussed the use of AI scribe software for clinical note transcription with the patient, who gave verbal consent to proceed.  History of Present Illness           ? Wayne Hernandez is a 52 y.o. male with a history of secondary polycythemia  Rt foot bone spur surgery three years ago and borderline diabetes, presents with a foot problem that started on July 4th. He noticed a swelling on his rt heel that would protrude and then retract and was discharging pus. The patient did not report any trauma or injury prior to the onset of the swelling. He has been managing his borderline diabetes with Jardiance and does not report any neuropathy.  Three years ago, the patient had surgery for a bone spur in 2021.  Post-surgery, the patient had an infection at the surgical site with pseudomonas . HE  received antibiotics ( not able to see those records)  Recently, the patient noticed a similar issue at the same site and sought medical attention. The patient underwent  surgery to remove the anchors from the Rt achilles on 11/25/22 and had bone biopsy and culture The patient has been on ciprofloxacin since the day of the recent surgery.The culture came back positive for pseudomonas susceptible to cipro and bone pathology was chronic osteo Today he is doing better No discharge from the surgical site   Past Medical History:  Diagnosis Date   Diabetes (HCC)    Diverticulitis    Elevated hemoglobin A1c    Elevated uric acid in blood    Esophagitis    Hypertension    Low HDL (under 40)    Overweight     Past Surgical History:  Procedure Laterality Date   COLONOSCOPY WITH PROPOFOL N/A 05/17/2022   Procedure: COLONOSCOPY WITH PROPOFOL;  Surgeon: Jaynie Collins, DO;  Location: [facility] ENDOSCOPY;  Service: Gastroenterology;  Laterality: N/A;   FOOT SURGERY Right 12/31/2019    Social History    Socioeconomic History   Marital status: Divorced    Spouse name: Not on file   Number of children: Not on file   Years of education: Not on file   Highest education level: Not on file  Occupational History   Not on file  Tobacco Use   Smoking status: Never   Smokeless tobacco: Never  Vaping Use   Vaping status: Never Used  Substance and Sexual Activity   Alcohol use: Not Currently    Comment: occasional   Drug use: Never   Sexual activity: Not on file  Other Topics Concern   Not on file  Social History Narrative   Not on file   Social Determinants of Health   Financial Resource Strain: Not on file  Food Insecurity: Not on file  Transportation Needs: Not on file  Physical Activity: Not on file  Stress: Not on file  Social Connections: Not on file  Intimate Partner Violence: Not on file    Family History  Problem Relation Age of Onset   Liver disease Mother        16 yrs S/P transplant   Colon cancer Paternal Grandfather    No Known Allergies I? Current Outpatient Medications  Medication Sig Dispense Refill   acetaminophen (TYLENOL) 500 MG tablet Take 2 tablets (1,000 mg total) by mouth every 6 (six) hours as needed for up to 14 days (pain). 112 tablet 0  aspirin EC 81 MG tablet Take 81 mg by mouth daily.     ciprofloxacin (CIPRO) 500 MG tablet Take 1 tablet (500 mg total) by mouth 2 (two) times daily for 10 days. 20 tablet 0   empagliflozin (JARDIANCE) 10 MG TABS tablet Take 1 tablet (10 mg total) by mouth daily before breakfast. 90 tablet 3   gentamicin ointment (GARAMYCIN) 0.1 % Apply 1 Application topically daily. 30 g 0   ibuprofen (ADVIL) 600 MG tablet Take 1 tablet (600 mg total) by mouth every 6 (six) hours as needed for up to 14 days. 56 tablet 0   lisinopril (ZESTRIL) 10 MG tablet Take 1 tablet (10 mg total) by mouth daily. 90 tablet 3   metFORMIN (GLUCOPHAGE) 1000 MG tablet Take 1 tablet (1,000 mg total) by mouth 2 (two) times daily with a meal. 180  tablet 3   mupirocin ointment (BACTROBAN) 2 % Apply 1 Application topically 2 (two) times daily. 15 g 0   rosuvastatin (CRESTOR) 40 MG tablet Take 1 tablet by mouth once daily 90 tablet 3   No current facility-administered medications for this visit.     Abtx:  Anti-infectives (From admission, onward)    None       REVIEW OF SYSTEMS:  Const: negative fever, negative chills, negative weight loss Eyes: negative diplopia or visual changes, negative eye pain ENT: negative coryza, negative sore throat Resp: negative cough, hemoptysis, dyspnea Cards: negative for chest pain, palpitations, lower extremity edema GU: negative for frequency, dysuria and hematuria GI: Negative for abdominal pain, diarrhea, bleeding, constipation Skin: negative for rash and pruritus Heme: negative for easy bruising and gum/nose bleeding MS: negative for myalgias, arthralgias, back pain and muscle weakness Neurolo:negative for headaches, dizziness, vertigo, memory problems  Psych: negative for feelings of anxiety, depression  Endocrine: negative for thyroid, diabetes Allergy/Immunology- negative for any medication or food allergies ? Pertinent Positives include : Objective:  VITALS:  BP (!) 150/88   Pulse 100   Temp (!) 97.1 F (36.2 C) (Temporal)  LDA Foley Central line Other drainage tubes PHYSICAL EXAM:  General: Alert, cooperative, no distress, appears stated age.  Head: Normocephalic, without obvious abnormality, atraumatic. Eyes: Conjunctivae clear, anicteric sclerae. Pupils are equal ENT Nares normal. No drainage or sinus tenderness. Lips, mucosa, and tongue normal. No Thrush Neck: Supple, symmetrical, no adenopathy, thyroid: non tender no carotid bruit and no JVD. Back: No CVA tenderness. Lungs: Clear to auscultation bilaterally. No Wheezing or Rhonchi. No rales. Heart: Regular rate and rhythm, no murmur, rub or gallop. Abdomen: Soft, non-tender,not distended. Bowel sounds normal. No  masses Extremities: atraumatic, no cyanosis. No edema. No clubbing Skin: No rashes or lesions. Or bruising Lymph: Cervical, supraclavicular normal. Neurologic: Grossly non-focal Pertinent Labs Lab Results CBC    Component Value Date/Time   WBC 8.7 04/24/2022 0827   WBC 11.8 (H) 06/17/2017 1436   RBC 6.69 (H) 04/24/2022 0827   RBC 6.10 (H) 06/17/2017 1436   HGB 17.7 04/24/2022 0827   HCT 53.7 (H) 04/24/2022 0827   PLT 309 04/24/2022 0827   MCV 80 04/24/2022 0827   MCH 26.5 (L) 04/24/2022 0827   MCH 29.1 06/17/2017 1436   MCHC 33.0 04/24/2022 0827   MCHC 34.8 06/17/2017 1436   RDW 14.5 04/24/2022 0827   LYMPHSABS 1.3 04/24/2022 0827   MONOABS 0.8 06/17/2017 1436   EOSABS 0.3 04/24/2022 0827   BASOSABS 0.1 04/24/2022 0827       Latest Ref Rng & Units 04/24/2022  8:27 AM 10/17/2021    8:19 AM 04/09/2021    9:16 AM  CMP  Glucose 70 - 99 mg/dL 132  440  102   BUN 6 - 24 mg/dL 14  14  11    Creatinine 0.76 - 1.27 mg/dL 7.25  3.66  4.40   Sodium 134 - 144 mmol/L 136  136  135   Potassium 3.5 - 5.2 mmol/L 4.7  4.7  4.4   Chloride 96 - 106 mmol/L 99  97  95   Calcium 8.7 - 10.2 mg/dL 9.3  8.9  9.3   Total Protein 6.0 - 8.5 g/dL 7.8  7.1  7.8   Total Bilirubin 0.0 - 1.2 mg/dL 0.4  0.5  0.5   Alkaline Phos 44 - 121 IU/L 123  111  109   AST 0 - 40 IU/L 24  18  22    ALT 0 - 44 IU/L 16  17  17        Microbiology:IMAGING RESULTS:  I have personally reviewed the films ? Impression/Recommendation Postoperative wound infection Recent surgery to remove anchors from previous Achilles tendon repair. Noted wound drainage and infection. Culture positive for Pseudomonas which is susceptible to cipro. Currently on Ciprofloxacin for the past 10 days with improvement noted. Chronic osteomyelitis bone biopsy -Continue Ciprofloxacin for an additional 2 weeks. ( May extend if needed) -Monitor for signs of tendon pain or weakness due to Ciprofloxacin use. -Check blood glucose levels  regularly due to potential impact of Ciprofloxacin on glucose control. -Return to surgeon for suture removal next week.  Diabetes Mellitus Borderline diabetes managed with Metformin and Jardiance. Last A1C was 6.6 in March. -Continue current medications. -Monitor blood glucose levels regularly, especially while on Ciprofloxacin.  Hypertension Managed with Lisinopril. Noted slightly elevated blood pressure during visit, potentially due to physical exertion. -Continue Lisinopril. -Monitor blood pressure regularly.  Hyperlipidemia Managed with Rosuvastatin.  Secondary Polycythemia- used to see oncologist- work up neg Also a carrier of hemochromatosis-   Labs today   ?Discussed the management with patient and his mother  Follow PRn  ________________________________________________ Discussed with patient, requesting provider Note:  This document was prepared using Dragon voice recognition software and may include unintentional dictation errors.

## 2022-12-11 ENCOUNTER — Ambulatory Visit (INDEPENDENT_AMBULATORY_CARE_PROVIDER_SITE_OTHER): Payer: 59 | Admitting: Podiatry

## 2022-12-11 ENCOUNTER — Ambulatory Visit (INDEPENDENT_AMBULATORY_CARE_PROVIDER_SITE_OTHER): Payer: 59

## 2022-12-11 ENCOUNTER — Encounter: Payer: Self-pay | Admitting: Podiatry

## 2022-12-11 VITALS — Ht 70.0 in | Wt 243.0 lb

## 2022-12-11 DIAGNOSIS — Z9889 Other specified postprocedural states: Secondary | ICD-10-CM

## 2022-12-11 DIAGNOSIS — M86471 Chronic osteomyelitis with draining sinus, right ankle and foot: Secondary | ICD-10-CM | POA: Diagnosis not present

## 2022-12-13 NOTE — Progress Notes (Signed)
  Subjective:  Patient ID: Wayne Hernandez, male    DOB: 1970/08/10,  MRN: [id number]  Chief Complaint  Patient presents with   Routine Post Op    Chronic heel ulcer, right, limited to breakdown of skin (HCC)  Here for suture removal and xrays      52 y.o. male returns for post-op check.  Doing well no issues  Review of Systems: Negative except as noted in the HPI. Denies N/V/F/Ch.   Objective:  There were no vitals filed for this visit. Body mass index is 34.87 kg/m. Constitutional Well developed. Well nourished.  Vascular Foot warm and well perfused. Capillary refill normal to all digits.  Calf is soft and supple, no posterior calf or knee pain, negative Homans' sign  Neurologic Normal speech. Oriented to person, place, and time. Epicritic sensation to light touch grossly present bilaterally.  Dermatologic Incision healed well. No hypertrophy or infection  Orthopedic: No pain to palpation        Multiple view plain film radiographs: No significant change or abnormality some bony debris in the posterior heel   Assessment:   1. Post-operative state   2. 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  S/p foot surgery right -Doing well, sutures removed, may transition back to regular shoe gear and bathe normally. Continue cipro, I discussed his case with Dr Rivka Safer. I will see him in 3 weeks for follow up   No follow-ups on file.

## 2022-12-19 ENCOUNTER — Encounter: Payer: 59 | Admitting: Podiatry

## 2022-12-23 ENCOUNTER — Ambulatory Visit (INDEPENDENT_AMBULATORY_CARE_PROVIDER_SITE_OTHER): Payer: 59 | Admitting: Podiatry

## 2022-12-23 ENCOUNTER — Encounter: Payer: Self-pay | Admitting: Podiatry

## 2022-12-23 DIAGNOSIS — M86471 Chronic osteomyelitis with draining sinus, right ankle and foot: Secondary | ICD-10-CM

## 2022-12-23 DIAGNOSIS — L988 Other specified disorders of the skin and subcutaneous tissue: Secondary | ICD-10-CM

## 2022-12-23 NOTE — Progress Notes (Signed)
  Subjective:  Patient ID: Wayne Hernandez, male    DOB: 20-Jun-1970,  MRN: [id number]  Chief Complaint  Patient presents with   Routine Post Op    "I tripped over a box and now that thing is nice and tender."      52 y.o. male returns for post-op check.  Doing well no issue, some skin peeled off  Review of Systems: Negative except as noted in the HPI. Denies N/V/F/Ch.   Objective:  There were no vitals filed for this visit. There is no height or weight on file to calculate BMI. Constitutional Well developed. Well nourished.  Vascular Foot warm and well perfused. Capillary refill normal to all digits.  Calf is soft and supple, no posterior calf or knee pain, negative Homans' sign  Neurologic Normal speech. Oriented to person, place, and time. Epicritic sensation to light touch grossly present bilaterally.  Dermatologic Incision healed well. No hypertrophy or infection. No drainage  Orthopedic: No pain to palpation     Results  Collected Updated Procedure   12/05/2022 1147 12/05/2022 1212 Comprehensive metabolic panel [[id number]]  (Abnormal)  Blood   Component Value Units  Sodium 134 Low  mmol/L  Potassium 4.0 mmol/L  Chloride 100 mmol/L  CO2 22 mmol/L  Glucose, Bld 111 High   mg/dL  BUN 17 mg/dL  Creatinine, Ser 0.34 mg/dL  Calcium 9.2 mg/dL  Total Protein 8.3 High  g/dL  Albumin 4.1 g/dL  AST 27 U/L  ALT 22 U/L  Alkaline Phosphatase 99 U/L  Total Bilirubin 0.7 mg/dL  GFR, Estimated >74  mL/min  Anion gap 12         12/05/2022 1147 12/05/2022 1542 C-reactive protein [[id number]]  Blood   Component Value Units  CRP 0.9  mg/dL       64/33/2951 8841 66/07/3014 1218 Sedimentation rate [[id number]]  Blood   Component Value Units  Sed Rate 1  mm/hr       12/05/2022 1147 12/05/2022 1156 CBC with Differential/Platelet [[id number]]  (Abnormal)  Blood   Component Value Units  WBC 10.2 K/uL  RBC 6.93 High  MIL/uL  Hemoglobin 18.6 High  g/dL  HCT 70.6 High  %   MCV 81.8 fL  MCH 26.8 pg  MCHC 32.8 g/dL  RDW 23.7 %  Platelets 365 K/uL  nRBC 0.0 %  Neutrophils Relative % 76 %  Neutro Abs 7.7 K/uL  Lymphocytes Relative 13 %  Lymphs Abs 1.4 K/uL  Monocytes Relative 7 %  Monocytes Absolute 0.7 K/uL  Eosinophils Relative 3 %  Eosinophils Absolute 0.3 K/uL  Basophils Relative 1 %  Basophils Absolute 0.1 K/uL  Immature Granulocytes 0 %  Abs Immature Granulocytes 0.04  K/uL       Multiple view plain film radiographs: No significant change or abnormality some bony debris in the posterior heel   Assessment:   1. Draining cutaneous sinus tract   2. 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  S/p foot surgery right -Healed at this point. He does not need further antibiotic therapy per my discussion with Dr Rivka Safer. Labs normal. If new signs/symptoms return he should let me know immediately and restart antibiotics.   Return if symptoms worsen or fail to improve.

## 2023-01-06 ENCOUNTER — Encounter: Payer: 59 | Admitting: Podiatry

## 2023-01-07 ENCOUNTER — Encounter: Payer: 59 | Admitting: Podiatry

## 2023-01-08 ENCOUNTER — Encounter: Payer: 59 | Admitting: Podiatry

## 2023-02-01 ENCOUNTER — Other Ambulatory Visit: Payer: Self-pay | Admitting: Physician Assistant

## 2023-04-08 ENCOUNTER — Ambulatory Visit: Payer: Self-pay

## 2023-04-08 DIAGNOSIS — Z0000 Encounter for general adult medical examination without abnormal findings: Secondary | ICD-10-CM

## 2023-04-08 DIAGNOSIS — R81 Glycosuria: Secondary | ICD-10-CM

## 2023-04-08 LAB — POCT URINALYSIS DIPSTICK
Bilirubin, UA: NEGATIVE
Blood, UA: NEGATIVE
Glucose, UA: POSITIVE — AB
Ketones, UA: NEGATIVE
Leukocytes, UA: NEGATIVE
Nitrite, UA: NEGATIVE
Protein, UA: NEGATIVE
Spec Grav, UA: 1.015 (ref 1.010–1.025)
Urobilinogen, UA: 0.2 U/dL
pH, UA: 5.5 (ref 5.0–8.0)

## 2023-04-08 NOTE — Progress Notes (Signed)
 Pt presents today to complete physical labs. Wayne Hernandez

## 2023-04-09 LAB — CMP12+LP+TP+TSH+6AC+PSA+CBC…
ALT: 16 [IU]/L (ref 0–44)
AST: 23 [IU]/L (ref 0–40)
Albumin: 4.3 g/dL (ref 3.8–4.9)
Alkaline Phosphatase: 118 [IU]/L (ref 44–121)
BUN/Creatinine Ratio: 12 (ref 9–20)
BUN: 11 mg/dL (ref 6–24)
Basophils Absolute: 0.1 10*3/uL (ref 0.0–0.2)
Basos: 1 %
Bilirubin Total: 0.5 mg/dL (ref 0.0–1.2)
Calcium: 9.4 mg/dL (ref 8.7–10.2)
Chloride: 95 mmol/L — ABNORMAL LOW (ref 96–106)
Chol/HDL Ratio: 3.6 {ratio} (ref 0.0–5.0)
Cholesterol, Total: 128 mg/dL (ref 100–199)
Creatinine, Ser: 0.93 mg/dL (ref 0.76–1.27)
EOS (ABSOLUTE): 0.2 10*3/uL (ref 0.0–0.4)
Eos: 2 %
Estimated CHD Risk: 0.6 times avg. (ref 0.0–1.0)
Free Thyroxine Index: 2.7 (ref 1.2–4.9)
GGT: 30 [IU]/L (ref 0–65)
Globulin, Total: 3.2 g/dL (ref 1.5–4.5)
Glucose: 100 mg/dL — ABNORMAL HIGH (ref 70–99)
HDL: 36 mg/dL — ABNORMAL LOW (ref >39)
Hematocrit: 56.1 % — ABNORMAL HIGH (ref 37.5–51.0)
Hemoglobin: 18.7 g/dL — ABNORMAL HIGH (ref 13.0–17.7)
Immature Grans (Abs): 0 10*3/uL (ref 0.0–0.1)
Immature Granulocytes: 0 %
Iron: 90 ug/dL (ref 38–169)
LDH: 173 [IU]/L (ref 121–224)
LDL Chol Calc (NIH): 66 mg/dL (ref 0–99)
Lymphocytes Absolute: 1.5 10*3/uL (ref 0.7–3.1)
Lymphs: 15 %
MCH: 27.9 pg (ref 26.6–33.0)
MCHC: 33.3 g/dL (ref 31.5–35.7)
MCV: 84 fL (ref 79–97)
Monocytes Absolute: 0.7 10*3/uL (ref 0.1–0.9)
Monocytes: 6 %
Neutrophils Absolute: 7.9 10*3/uL — ABNORMAL HIGH (ref 1.4–7.0)
Neutrophils: 76 %
Phosphorus: 2.9 mg/dL (ref 2.8–4.1)
Platelets: 305 10*3/uL (ref 150–450)
Potassium: 4.8 mmol/L (ref 3.5–5.2)
Prostate Specific Ag, Serum: 0.4 ng/mL (ref 0.0–4.0)
RBC: 6.7 x10E6/uL — ABNORMAL HIGH (ref 4.14–5.80)
RDW: 14.5 % (ref 11.6–15.4)
Sodium: 134 mmol/L (ref 134–144)
T3 Uptake Ratio: 26 % (ref 24–39)
T4, Total: 10.4 ug/dL (ref 4.5–12.0)
TSH: 0.977 u[IU]/mL (ref 0.450–4.500)
Total Protein: 7.5 g/dL (ref 6.0–8.5)
Triglycerides: 153 mg/dL — ABNORMAL HIGH (ref 0–149)
Uric Acid: 4.4 mg/dL (ref 3.8–8.4)
VLDL Cholesterol Cal: 26 mg/dL (ref 5–40)
WBC: 10.5 10*3/uL (ref 3.4–10.8)
eGFR: 98 mL/min/{1.73_m2} (ref >59)

## 2023-04-09 LAB — MICROALBUMIN / CREATININE URINE RATIO
Creatinine, Urine: 62.7 mg/dL
Microalb/Creat Ratio: 13 mg/g{creat} (ref 0–29)
Microalbumin, Urine: 7.9 ug/mL

## 2023-04-09 LAB — HGB A1C W/O EAG: Hgb A1c MFr Bld: 6.6 % — ABNORMAL HIGH (ref 4.8–5.6)

## 2023-04-15 ENCOUNTER — Ambulatory Visit: Payer: Self-pay | Admitting: Physician Assistant

## 2023-04-15 ENCOUNTER — Encounter: Payer: Self-pay | Admitting: Physician Assistant

## 2023-04-15 VITALS — BP 143/83 | HR 100 | Temp 97.1°F | Resp 16 | Ht 70.0 in | Wt 248.0 lb

## 2023-04-15 DIAGNOSIS — Z0000 Encounter for general adult medical examination without abnormal findings: Secondary | ICD-10-CM

## 2023-04-15 NOTE — Progress Notes (Signed)
 Here for yearly physical and stated he feels better than he has in years after recovering from the foot infection while being treated with [facility].  Stated he doesn't check his sugar at home, but aware of A1C.

## 2023-04-15 NOTE — Progress Notes (Signed)
 [facility] ____________________________________________   None    (approximate)  I have reviewed the triage vital signs and the nursing notes.   HISTORY  Chief Complaint No chief complaint on file.    HPI Wayne Hernandez is a 53 y.o. male patient presents annual physical exam.  Patient voices no concerns or complaints.         Past Medical History:  Diagnosis Date   Diabetes (HCC)    Diverticulitis    Elevated hemoglobin A1c    Elevated uric acid in blood    Esophagitis    Hypertension    Low HDL (under 40)    Overweight     Patient Active Problem List   Diagnosis Date Noted   Polycythemia, secondary 11/26/2016    Past Surgical History:  Procedure Laterality Date   COLONOSCOPY WITH PROPOFOL N/A 05/17/2022   Procedure: COLONOSCOPY WITH PROPOFOL;  Surgeon: Jaynie Collins, DO;  Location: [facility] ENDOSCOPY;  Service: Gastroenterology;  Laterality: N/A;   FOOT SURGERY Right 12/31/2019    Prior to Admission medications   Medication Sig Start Date End Date Taking? Authorizing Provider  aspirin EC 81 MG tablet Take 81 mg by mouth daily.   Yes [provider]  JARDIANCE 10 MG TABS tablet TAKE 1 TABLET BY MOUTH ONCE DAILY BEFORE BREAKFAST 02/01/23  Yes Joni Reining, PA-C  lisinopril (ZESTRIL) 10 MG tablet Take 1 tablet (10 mg total) by mouth daily. 10/03/22  Yes Joni Reining, PA-C  metFORMIN (GLUCOPHAGE) 1000 MG tablet Take 1 tablet (1,000 mg total) by mouth 2 (two) times daily with a meal. 10/09/22  Yes Joni Reining, PA-C  rosuvastatin (CRESTOR) 40 MG tablet Take 1 tablet by mouth once daily 10/21/22  Yes Joni Reining, PA-C    Allergies Patient has no known allergies.  Family History  Problem Relation Age of Onset   Liver disease Mother        59 yrs S/P transplant   Colon cancer Paternal Grandfather     Social History Social History   Tobacco Use   Smoking status: Never   Smokeless tobacco: Never   Vaping Use   Vaping status: Never Used  Substance Use Topics   Alcohol use: Not Currently    Comment: occasional   Drug use: Never    Review of Systems Constitutional: No fever/chills Eyes: No visual changes. ENT: No sore throat. Cardiovascular: Denies chest pain. Respiratory: Denies shortness of breath. Gastrointestinal: No abdominal pain.  No nausea, no vomiting.  No diarrhea.  No constipation. Genitourinary: Negative for dysuria. Musculoskeletal: Negative for back pain. Skin: Negative for rash. Neurological: Negative for headaches, focal weakness or numbness. Endocrine: Diabetes, hyperlipidemia, and hypertension. ____________________________________________   PHYSICAL EXAM:  VITAL SIGNS: BP 143/83BP. 143/83. Data is abnormal. Taken on 04/15/23 8:41 AM Pulse Rate100Temp 97.1 F (36.2 C)Temp. 97.1 F (36.2 C). Data is abnormal. Taken on 04/15/23 8:41 AM [id number] lb (112.5 kg)Height5\' 10"  (1.778 m)[id number] % BMI: 35.58 kg/m2  BSA: 2.36 m2   Constitutional: Alert and oriented. Well appearing and in no acute distress. Eyes: Conjunctivae are normal. PERRL. EOMI. Head: Atraumatic. Nose: No congestion/rhinnorhea. Mouth/Throat: Mucous membranes are moist.  Oropharynx non-erythematous. Neck: No stridor.  No cervical spine tenderness to palpation. Hematological/Lymphatic/Immunilogical: No cervical lymphadenopathy. Cardiovascular: Normal rate, regular rhythm. Grossly normal heart sounds.  Good peripheral circulation. Respiratory: Normal respiratory effort.  No retractions. Lungs CTAB. Gastrointestinal: Soft and nontender. No distention. No abdominal bruits. No CVA tenderness. Genitourinary: Deferred  Musculoskeletal: No lower extremity tenderness nor edema.  No joint effusions. Neurologic:  Normal speech and language. No gross focal neurologic deficits are appreciated. No gait instability. Skin:  Skin is warm, dry and intact. No rash noted. Psychiatric: Mood and affect are  normal. Speech and behavior are normal.  ____________________________________________   LABS           Component Ref Range & Units (hover) 7 d ago (04/08/23) 11 mo ago (04/24/22) 2 yr ago (04/09/21) 2 yr ago (08/28/20) 3 yr ago (06/08/19) 8 yr ago (07/14/14)  Color, UA yellow yellow yellow yellow Dark Yellow   Clarity, UA clear clear clear clear Clear   Glucose, UA Positive Abnormal  Positive Abnormal  CM Negative Negative Negative   Comment: 3+  Bilirubin, UA neg neg negative negative Negative   Ketones, UA neg neg negative negative Negative   Spec Grav, UA 1.015 1.020 1.010 >=1.030 Abnormal  1.020   Blood, UA neg neg negative negative Negative   pH, UA 5.5 5.5 6.0 5.5 6.0   Protein, UA Negative Positive Abnormal  CM Negative Positive Abnormal  CM Negative   Urobilinogen, UA 0.2 0.2 0.2 0.2 0.2   Nitrite, UA neg neg negative negative Negative   Leukocytes, UA Negative Negative Trace Abnormal  Negative Moderate (2+) Abnormal  TRACE Abnormal  R  Appearance  dark  dark  CLEAR Abnormal  R  Odor        Resulting Agency      [facility]             View All Conversations on this Encounter    0 Result Notes          Component Ref Range & Units (hover) 7 d ago (04/08/23) 4 mo ago (12/05/22) 4 mo ago (12/05/22) 11 mo ago (04/24/22) 1 yr ago (10/17/21) 2 yr ago (04/09/21) 2 yr ago (09/14/20)  Glucose 100 High  111 High  CM  112 High  121 High  117 High    Uric Acid 4.4   3.9 CM 7.2 CM 7.1 CM   Comment:            Therapeutic target for gout patients: <6.0  BUN 11 17 R  14 14 11 12   Creatinine, Ser 0.93 0.90 R  0.97 0.93 0.97 0.93  eGFR 98   94 99 95 100  BUN/Creatinine Ratio 12   14 15 11 13   Sodium 134 134 Low  R  136 136 135   Potassium 4.8 4.0 R  4.7 4.7 4.4   Chloride 95 Low  100 R  99 97 95 Low    Calcium 9.4 9.2 R  9.3 8.9 9.3   Phosphorus 2.9   2.7 Low  2.6 Low  2.4 Low    Total Protein 7.5 8.3 High  R  7.8 7.1 7.8   Albumin 4.3 4.1 R  4.6 4.4 4.7   Globulin, Total 3.2    3.2 2.7 3.1   Bilirubin Total 0.5 0.7 R  0.4 0.5 0.5   Alkaline Phosphatase 118 99 R  123 High  111 109   LDH 173   213 161 168   AST 23 27 R  24 18 22    ALT 16 22 R  16 17 17    GGT 30   26 29 24    Iron 90   70 110 94   Cholesterol, Total 128   121 211 High  234 High    Triglycerides  153 High    102 165 High  136   HDL 36 Low    36 Low  40 42   VLDL Cholesterol Cal 26   19 30 25    LDL Chol Calc (NIH) 66   66 141 High  167 High    Chol/HDL Ratio 3.6   3.4 CM 5.3 High  CM 5.6 High  CM   Comment:                                   T. Chol/HDL Ratio                                             Men  Women                               1/2 Avg.Risk  3.4    3.3                                   Avg.Risk  5.0    4.4                                2X Avg.Risk  9.6    7.1                                3X Avg.Risk 23.4   11.0  Estimated CHD Risk 0.6   0.5 CM 1.1 High  CM 1.2 High  CM   Comment: The CHD Risk is based on the T. Chol/HDL ratio. Other factors affect CHD Risk such as hypertension, smoking, diabetes, severe obesity, and family history of premature CHD.  TSH 0.977   0.828 1.040 0.908   T4, Total 10.4   8.3 8.0 10.0   T3 Uptake Ratio 26   24 22  Low  23 Low    Free Thyroxine Index 2.7   2.0 1.8 2.3   Prostate Specific Ag, Serum 0.4   0.3 CM 0.8 CM 0.5 CM   Comment: Roche ECLIA methodology. According to the American Urological Association, Serum PSA should decrease and remain at undetectable levels after radical prostatectomy. The AUA defines biochemical recurrence as an initial PSA value 0.2 ng/mL or greater followed by a subsequent confirmatory PSA value 0.2 ng/mL or greater. Values obtained with different assay methods or kits cannot be used interchangeably. Results cannot be interpreted as absolute evidence of the presence or absence of malignant disease.  WBC 10.5  10.2 R 8.7 10.3 11.1 High    RBC 6.70 High   6.93 High  R 6.69 High  6.54 High  6.73 High    Hemoglobin 18.7  High   18.6 High  R 17.7 17.9 High  18.6 High    Hematocrit 56.1 High   56.7 High  R 53.7 High  52.9 High  55.3 High    MCV 84  81.8 R 80 81 82   MCH 27.9  26.8 R 26.5 Low  27.4 27.6   MCHC 33.3  32.8 R 33.0 33.8 33.6   RDW 14.5  14.0  R 14.5 13.8 13.3   Platelets 305  365 R 309 337 345   Neutrophils 76  76 R 74 72 77   Lymphs 15   15 18 13    Monocytes 6   7 6 6    Eos 2   3 3 3    Basos 1   1 1 1    Neutrophils Absolute 7.9 High   7.7 R 6.4 7.5 High  8.6 High    Lymphocytes Absolute 1.5  1.4 R 1.3 1.8 1.4   Monocytes Absolute 0.7   0.6 0.6 0.6   EOS (ABSOLUTE) 0.2   0.3 0.3 0.3   Basophils Absolute 0.1  0.1 R 0.1 0.1 0.1   Immature Granulocytes 0  0 R 0 0 0   Immature Grans (Abs) 0.0   0.0 0.0 0.0   Resulting Agency [facility]          View All Conversations on this Encounter                Component Ref Range & Units (hover) 7 d ago (04/08/23) 11 mo ago (04/24/22) 1 yr ago (10/17/21) 2 yr ago (04/09/21) 2 yr ago (09/14/20) 2 yr ago (06/27/20) 3 yr ago (03/23/20)  Hgb A1c MFr Bld 6.6 High  6.6 High  CM 6.9 High  CM 6.6 High  CM 6.6 High  CM 6.5 High  CM 5.7 High  CM  Comment:          Prediabetes: 5.7 - 6.4          Diabetes: >6.4          Glycemic control for adults with diabetes: <7.0              ____________________________________________  EKG  Sinus rhythm at 80 bpm.  Old infarct.  Asymptomatic. ____________________________________________    ____________________________________________   INITIAL IMPRESSION / ASSESSMENT AND PLAN  As part of my medical decision making, I reviewed the following data within the electronic MEDICAL RECORD NUMBER      No acute findings on physical exam, EKG, or labs.        ____________________________________________   FINAL CLINICAL IMPRESSION  Well exam   ED Discharge Orders     None        Note:  This document was prepared using Dragon voice recognition  software and may include unintentional dictation errors.

## 2023-04-26 ENCOUNTER — Other Ambulatory Visit: Payer: Self-pay | Admitting: Physician Assistant

## 2023-05-21 DIAGNOSIS — Z7984 Long term (current) use of oral hypoglycemic drugs: Secondary | ICD-10-CM | POA: Diagnosis not present

## 2023-05-21 DIAGNOSIS — E119 Type 2 diabetes mellitus without complications: Secondary | ICD-10-CM | POA: Diagnosis not present

## 2023-05-21 DIAGNOSIS — H52223 Regular astigmatism, bilateral: Secondary | ICD-10-CM | POA: Diagnosis not present

## 2023-05-21 DIAGNOSIS — H524 Presbyopia: Secondary | ICD-10-CM | POA: Diagnosis not present

## 2023-05-21 DIAGNOSIS — H5203 Hypermetropia, bilateral: Secondary | ICD-10-CM | POA: Diagnosis not present

## 2023-07-26 ENCOUNTER — Other Ambulatory Visit: Payer: Self-pay | Admitting: Physician Assistant

## 2023-09-22 ENCOUNTER — Other Ambulatory Visit: Payer: Self-pay | Admitting: Physician Assistant

## 2023-09-22 DIAGNOSIS — I10 Essential (primary) hypertension: Secondary | ICD-10-CM

## 2023-10-11 ENCOUNTER — Other Ambulatory Visit: Payer: Self-pay | Admitting: Physician Assistant

## 2023-10-11 DIAGNOSIS — E0800 Diabetes mellitus due to underlying condition with hyperosmolarity without nonketotic hyperglycemic-hyperosmolar coma (NKHHC): Secondary | ICD-10-CM

## 2023-10-19 ENCOUNTER — Other Ambulatory Visit: Payer: Self-pay | Admitting: Physician Assistant

## 2023-10-19 DIAGNOSIS — E782 Mixed hyperlipidemia: Secondary | ICD-10-CM

## 2023-10-23 ENCOUNTER — Other Ambulatory Visit: Payer: Self-pay | Admitting: Physician Assistant

## 2023-10-23 DIAGNOSIS — E0800 Diabetes mellitus due to underlying condition with hyperosmolarity without nonketotic hyperglycemic-hyperosmolar coma (NKHHC): Secondary | ICD-10-CM

## 2023-11-19 ENCOUNTER — Other Ambulatory Visit: Payer: Self-pay | Admitting: Physician Assistant

## 2023-11-19 NOTE — Progress Notes (Signed)
 [facility] Results pending with [facility]

## 2023-12-20 ENCOUNTER — Other Ambulatory Visit: Payer: Self-pay | Admitting: Physician Assistant

## 2023-12-20 DIAGNOSIS — I10 Essential (primary) hypertension: Secondary | ICD-10-CM

## 2023-12-22 ENCOUNTER — Other Ambulatory Visit: Payer: Self-pay

## 2023-12-22 DIAGNOSIS — I10 Essential (primary) hypertension: Secondary | ICD-10-CM

## 2023-12-22 MED ORDER — LISINOPRIL 10 MG PO TABS: 10.0000 mg | ORAL_TABLET | Freq: Every day | ORAL | 3 refills | Status: AC

## 2024-01-07 ENCOUNTER — Other Ambulatory Visit: Payer: Self-pay | Admitting: Physician Assistant

## 2024-01-07 DIAGNOSIS — E0800 Diabetes mellitus due to underlying condition with hyperosmolarity without nonketotic hyperglycemic-hyperosmolar coma (NKHHC): Secondary | ICD-10-CM

## 2024-01-13 ENCOUNTER — Other Ambulatory Visit: Payer: Self-pay | Admitting: Physician Assistant

## 2024-01-13 DIAGNOSIS — E782 Mixed hyperlipidemia: Secondary | ICD-10-CM
